# Patient Record
Sex: Female | Born: 1950 | Race: Black or African American | Hispanic: No | Marital: Single | Smoking: Never smoker
Health system: Southern US, Community
[De-identification: ages and names within clinical notes are randomized; demographics above are authoritative.]

## PROBLEM LIST (undated history)

## (undated) DIAGNOSIS — F17200 Nicotine dependence, unspecified, uncomplicated: Secondary | ICD-10-CM

## (undated) DIAGNOSIS — R5383 Other fatigue: Secondary | ICD-10-CM

## (undated) HISTORY — DX: Other fatigue: R53.83

## (undated) HISTORY — PX: TOOTH EXTRACTION: SUR596

## (undated) HISTORY — DX: Nicotine dependence, unspecified, uncomplicated: F17.200

---

## 2016-03-25 ENCOUNTER — Encounter (HOSPITAL_COMMUNITY): Payer: Self-pay | Admitting: Emergency Medicine

## 2016-03-25 ENCOUNTER — Emergency Department (HOSPITAL_COMMUNITY)
Admission: EM | Admit: 2016-03-25 | Discharge: 2016-03-25 | Disposition: A | Discharge status: Home / Self Care | Payer: Medicare Other | Attending: Emergency Medicine | Admitting: Emergency Medicine

## 2016-03-25 DIAGNOSIS — K0889 Other specified disorders of teeth and supporting structures: Secondary | ICD-10-CM | POA: Insufficient documentation

## 2016-03-25 DIAGNOSIS — K1379 Other lesions of oral mucosa: Secondary | ICD-10-CM

## 2016-03-25 DIAGNOSIS — N939 Abnormal uterine and vaginal bleeding, unspecified: Secondary | ICD-10-CM

## 2016-03-25 DIAGNOSIS — N938 Other specified abnormal uterine and vaginal bleeding: Secondary | ICD-10-CM | POA: Insufficient documentation

## 2016-03-25 LAB — CBC WITH DIFFERENTIAL/PLATELET
Basophils Absolute: 0 10*3/uL (ref 0.0–0.1)
Basophils Relative: 0 %
EOS ABS: 0.1 10*3/uL (ref 0.0–0.7)
EOS PCT: 1 %
HCT: 41.8 % (ref 36.0–46.0)
Hemoglobin: 13.6 g/dL (ref 12.0–15.0)
Lymphocytes Relative: 38 %
Lymphs Abs: 3 10*3/uL (ref 0.7–4.0)
MCH: 26.2 pg (ref 26.0–34.0)
MCHC: 32.5 g/dL (ref 30.0–36.0)
MCV: 80.4 fL (ref 78.0–100.0)
MONO ABS: 0.6 10*3/uL (ref 0.1–1.0)
Monocytes Relative: 7 %
Neutro Abs: 4.2 10*3/uL (ref 1.7–7.7)
Neutrophils Relative %: 54 %
PLATELETS: 195 10*3/uL (ref 150–400)
RBC: 5.2 MIL/uL — AB (ref 3.87–5.11)
RDW: 13.3 % (ref 11.5–15.5)
WBC: 7.9 10*3/uL (ref 4.0–10.5)

## 2016-03-25 LAB — URINE MICROSCOPIC-ADD ON: RBC / HPF: NONE SEEN RBC/hpf (ref 0–5)

## 2016-03-25 LAB — COMPREHENSIVE METABOLIC PANEL
ALK PHOS: 91 U/L (ref 38–126)
ALT: 52 U/L (ref 14–54)
AST: 37 U/L (ref 15–41)
Albumin: 4 g/dL (ref 3.5–5.0)
Anion gap: 11 (ref 5–15)
BILIRUBIN TOTAL: 0.5 mg/dL (ref 0.3–1.2)
BUN: 7 mg/dL (ref 6–20)
CO2: 24 mmol/L (ref 22–32)
CREATININE: 0.71 mg/dL (ref 0.44–1.00)
Calcium: 9.2 mg/dL (ref 8.9–10.3)
Chloride: 103 mmol/L (ref 101–111)
GFR calc Af Amer: >60 mL/min (ref >60)
Glucose, Bld: 196 mg/dL — ABNORMAL HIGH (ref 65–99)
Potassium: 4.3 mmol/L (ref 3.5–5.1)
Sodium: 138 mmol/L (ref 135–145)
TOTAL PROTEIN: 7.5 g/dL (ref 6.5–8.1)

## 2016-03-25 LAB — URINALYSIS, ROUTINE W REFLEX MICROSCOPIC
Bilirubin Urine: NEGATIVE
Glucose, UA: NEGATIVE mg/dL
HGB URINE DIPSTICK: NEGATIVE
KETONES UR: NEGATIVE mg/dL
Nitrite: NEGATIVE
PROTEIN: NEGATIVE mg/dL
Specific Gravity, Urine: 1.014 (ref 1.005–1.030)
pH: 5.5 (ref 5.0–8.0)

## 2016-03-25 MED ORDER — SODIUM CHLORIDE 0.9 % IV BOLUS (SEPSIS)
1000.0000 mL | Freq: Once | INTRAVENOUS | Status: AC
  Administered 2016-03-25: 1000 mL via INTRAVENOUS

## 2016-03-25 MED ORDER — PENICILLIN V POTASSIUM 500 MG PO TABS: 500.0000 mg | ORAL_TABLET | Freq: Four times a day (QID) | ORAL | Status: AC

## 2016-03-25 MED ORDER — MORPHINE SULFATE (PF) 4 MG/ML IV SOLN
4.0000 mg | Freq: Once | INTRAVENOUS | Status: AC
  Administered 2016-03-25: 4 mg via INTRAVENOUS
  Filled 2016-03-25: qty 1

## 2016-03-25 MED ORDER — NAPROXEN 500 MG PO TABS: 500.0000 mg | ORAL_TABLET | Freq: Two times a day (BID) | ORAL | Status: DC

## 2016-03-25 MED ORDER — LIDOCAINE VISCOUS 2 % MT SOLN
15.0000 mL | Freq: Once | OROMUCOSAL | Status: AC
  Administered 2016-03-25: 15 mL via OROMUCOSAL
  Filled 2016-03-25: qty 15

## 2016-03-25 MED ORDER — LIDOCAINE VISCOUS 2 % MT SOLN: 15.0000 mL | OROMUCOSAL | Status: DC | PRN

## 2016-03-25 MED ORDER — ONDANSETRON HCL 4 MG/2ML IJ SOLN
4.0000 mg | Freq: Once | INTRAMUSCULAR | Status: AC
  Administered 2016-03-25: 4 mg via INTRAVENOUS
  Filled 2016-03-25: qty 2

## 2016-03-25 NOTE — ED Provider Notes (Signed)
CSN: [id number]     Arrival date & time 03/25/16  0846 History   First MD Initiated Contact with Patient 03/25/16 [phone]     Chief Complaint  Patient presents with  . Vaginal Bleeding     (Consider location/radiation/quality/duration/timing/severity/associated sxs/prior Treatment) HPI   Annette Macdonald is a 65 y.o. female, Poor dentition and tooth extraction, presenting to the ED with vaginal bleeding and mouth pain since 2009. Pt states she was given a dental device in late 2008 in [place] that she states was experimental. It was an insert that was supposed to mimic a prosthesis. Pt states, "I used this device for a few months and then it began to rot my teeth. Then I began to have occasional vaginal bleeding." Pt has been using tylenol to control the pain. Pt rates her pain at 10/10, aching/soreness, nonradiating. Pt states she needs a referral to an oral surgeon for this issue, in addition to help with the pain. Patient denies fever/chills, nausea/vomiting, difficulty breathing or swallowing, or any other complaints.    History reviewed. No pertinent past medical history. Past Surgical History  Procedure Laterality Date  . Tooth extraction     No family history on file. Social History  Substance Use Topics  . Smoking status: Never Smoker   . Smokeless tobacco: None  . Alcohol Use: No   OB History    No data available     Review of Systems  Constitutional: Negative for fever and chills.  HENT: Positive for dental problem. Negative for trouble swallowing and voice change.   Respiratory: Negative for shortness of breath.   Cardiovascular: Negative for chest pain.  Gastrointestinal: Negative for nausea, vomiting and abdominal pain.  Genitourinary: Positive for vaginal bleeding.  Neurological: Negative for dizziness, light-headedness and headaches.  All other systems reviewed and are negative.     Allergies  Review of patient's allergies indicates no known  allergies.  Home Medications   Prior to Admission medications   Medication Sig Start Date End Date Taking? Authorizing Provider  acetaminophen (TYLENOL) 500 MG tablet Take 500 mg by mouth every 6 (six) hours as needed for mild pain.   Yes Historical Provider, MD  lidocaine (XYLOCAINE) 2 % solution Use as directed 15 mLs in the mouth or throat as needed for mouth pain. 03/25/16   Theda Payer C Khyla Mccumbers, PA-C  naproxen (NAPROSYN) 500 MG tablet Take 1 tablet (500 mg total) by mouth 2 (two) times daily. 03/25/16   Rea Reser C Elayjah Chaney, PA-C  penicillin v potassium (VEETID) 500 MG tablet Take 1 tablet (500 mg total) by mouth 4 (four) times daily. 03/25/16 04/01/16  Alithia Zavaleta C Stevi Hollinshead, PA-C   BP 146/68 mmHg  Pulse 66  Temp(Src) 98.7 F (37.1 C) (Oral)  Resp 18  Ht 5\' 8"  (1.727 m)  Wt 73.965 kg  BMI 24.80 kg/m2  SpO2 97% Physical Exam  Constitutional: She appears well-developed and well-nourished. No distress.  HENT:  Head: Normocephalic and atraumatic.  Entire set of maxillary teeth is gone with some stubs remaining. Those remaining stubs are blackened. Significant dental caries with decay noted on a few mandibular teeth. Tenderness throughout every gingival surface. No swelling or area of fluctuance to suggest an abscess.  Eyes: Conjunctivae are normal. Pupils are equal, round, and reactive to light.  Neck: Neck supple.  Cardiovascular: Normal rate, regular rhythm, normal heart sounds and intact distal pulses.   Pulmonary/Chest: Effort normal and breath sounds normal. No respiratory distress.  Abdominal: Soft. Bowel sounds are normal. There  is no tenderness. There is no guarding.  Musculoskeletal: She exhibits no edema or tenderness.  Lymphadenopathy:    She has no cervical adenopathy.  Neurological: She is alert.  Skin: Skin is warm and dry. She is not diaphoretic.  Psychiatric: She has a normal mood and affect. Her behavior is normal.  Nursing note and vitals reviewed.   ED Course  Procedures (including critical  care time) Labs Review Labs Reviewed  COMPREHENSIVE METABOLIC PANEL - Abnormal; Notable for the following:    Glucose, Bld 196 (*)    All other components within normal limits  CBC WITH DIFFERENTIAL/PLATELET - Abnormal; Notable for the following:    RBC 5.20 (*)    All other components within normal limits  URINALYSIS, ROUTINE W REFLEX MICROSCOPIC (NOT AT [facility]) - Abnormal; Notable for the following:    Leukocytes, UA SMALL (*)    All other components within normal limits  URINE MICROSCOPIC-ADD ON - Abnormal; Notable for the following:    Squamous Epithelial / LPF 6-30 (*)    Bacteria, UA RARE (*)    All other components within normal limits    Imaging Review No results found. I have personally reviewed and evaluated these lab results as part of my medical decision-making.   EKG Interpretation None      MDM   Final diagnoses:  Mouth pain  Vaginal bleeding    Annette Macdonald presents with mouth pain and vaginal bleeding.  Findings and plan of care discussed with Gareth Morgan, MD.   Patient is nontoxic appearing, afebrile, not tachycardic, not tachypneic, maintains SPO2 of 97-98% on room air, and is in no apparent distress. Patient has no signs of sepsis or other serious or life-threatening condition. There are no signs or symptoms of acute or emergent conditions. No indication for imaging here in the ED. Referral to dentistry, PCP, and OBGYN given. Case management consult placed to give the patient assistance resources in the area. Patient advised to follow-up with a dentist, establish herself with a PCP, and address her vaginal bleeding with OB/GYN. Return precautions discussed. Patient voiced understanding of these instructions, agrees to the plan, and is comfortable with discharge.      Filed Vitals:   03/25/16 0905 03/25/16 0908 03/25/16 1132 03/25/16 1133  BP: 143/77  149/91 149/91  Pulse: 108   81  Temp: 98.7 F (37.1 C)     TempSrc: Oral     Resp: 18   17   Height: 5\' 8"  (1.727 m)     Weight:  73.965 kg    SpO2: 98%   98%   Filed Vitals:   03/25/16 0908 03/25/16 1132 03/25/16 1133 03/25/16 1230  BP:  149/91 149/91 146/68  Pulse:   81 66  Temp:      TempSrc:      Resp:   17 18  Height:      Weight: 73.965 kg     SpO2:   98% 97%     Lorayne Bender, PA-C 03/26/16 [place], MD 03/29/16 1104

## 2016-03-25 NOTE — ED Notes (Signed)
Pt c/o vaginal bleed that began back in 2009. Pt reports that a dentist did an experimental procedure that caused her teeth to rot out.

## 2016-03-25 NOTE — Care Management Note (Signed)
Case Management Note  Patient Details  Name: Jahyra Marbach MRN: [id number] Date of Birth: 1951-10-20  Subjective/Objective:                  65 y.o. female, presenting to the ED with vaginal bleeding and mouth pain since 2009./  Home alone.   Action/Plan: Follow for disposition needs. /Provide resources for follow-up   Expected Discharge Date:       03/25/16           Expected Discharge Plan:  Home/Self Care  In-House Referral:  NA  Discharge planning Services  CM Consult  Post Acute Care Choice:    Choice offered to:     DME Arranged:    DME Agency:     HH Arranged:    HH Agency:     Status of Service:     Medicare Important Message Given:    Date Medicare IM Given:    Medicare IM give by:    Date Additional Medicare IM Given:    Additional Medicare Important Message give by:     If discussed at [place] of Stay Meetings, dates discussed:    Additional Comments: EDCM consulted to assist with community resources for PCP and GYN. EDCM spoke with pt at beside who states she has PCP but needs oral surgeon and GYN.  EDCM provided list of GYN in area.  Pt states she will call number on insurance card for dental resource to insure services will be paid for.  No further CM needs communicated at this time. Fuller Mandril, RN 03/25/2016, 11:24 AM

## 2016-03-25 NOTE — Discharge Planning (Signed)
EDCM consulted to assist with community resources for PCP and GYN. EDCM spoke with pt at beside who states she has PCP but needs oral surgeon and GYN.  EDCM provided list of GYN in area.  Pt states she will call number on insurance card for dental resource to insure services will be paid for.  No further CM needs communicated at this time.

## 2016-03-25 NOTE — Discharge Instructions (Signed)
You have been seen today for mouth pain and vaginal bleeding. Your lab tests showed no abnormalities.  1. Follow up with a dentist as soon as possible, but certainly within the timeframe you are taking the antibiotic. See the attached resource guide to help you select a dentist. The dentist will have to refer you to an oral surgeon, as we are unable make that referral. Please take all of your antibiotics until finished!   You may develop abdominal discomfort or diarrhea from the antibiotic.  You may help offset this with probiotics which you can buy or get in yogurt. Do not eat or take the probiotics until 2 hours after your antibiotic.  2. Follow-up with OB/GYN as soon as possible for the vaginal bleeding. Call the women's outpatient clinic to set up an appointment. 3. Follow up with PCP as needed. Return to ED should symptoms worsen.  Dental Assistance If the dentist on-call cannot see you, please use the resources below:   Patients with Medicaid: [place], [place] [address], [phone]  If unable to pay, or uninsured, contact [facility] [phone]) or [place] [phone] in [place], [place] in [facility]) to become qualified for the adult dental clinic  Other [place]- [place], [place], [place], [address]    [phone], Ext. [phone]    2nd and 4th Thursday of the month at 6:30am    10 clients each day by appointment, can sometimes see walk-in     patients if someone does not show for an appointment [facility]- [address] [place], [place], [address]    [phone] [facility]- [address], [place], [place], [address]    [phone]  [place] [phone]  RESOURCE GUIDE  Chronic Pain Problems: [facility]  [phone] Patients need to be referred by their primary care doctor.  Insufficient Money for Medicine: Contact [employer]:  call "211" or [place] [phone].  No Primary Care Doctor: - Call [facility] Connect  [phone] - can help you locate a primary care doctor that  accepts your insurance, provides certain services, etc. - Physician Referral [phone]  Agencies that provide inexpensive medical care: - [facility]  [facility]  [phone] - [facility]  [phone] - [facility]  [phone] - [facility]  [facility]  [phone]  [place] Providers: - [facility]- [address]  [phone], Mon-Fri 9am-7pm, Sat 9am-1pm - [facility]- [place], [address] [place]  [place], [address]  [place]- [address]  [place], [address], [phone]  Only accepts [place] patients after they have their name  applied to their card  Self Pay (no insurance) in [place]: - Sickle Cell Patients: Dr Kevan Ny, [facility]  [place], [facility]- [place]  [facility] [place]- [place], [place]  [facility]- see information above (Speak to [place] if you do not have insurance)       -  [facility]- [place], [place],  [place], [place]  Dr Vista Lawman-  [address], [place], [facility]- [address], [phone]       -  [facility]- [address]  [place], [place], also [address], [phone]       -    [facility]- [address], [place], 1st & 3rd Saturday   every month, 10am-1pm  1) Find a Doctor and Pay Out of Pocket Although you won't have to find out who is covered by your insurance plan, it is a good idea to ask around and get recommendations. You will then need to call the office and see if the doctor you have chosen will accept you as a new patient and what types of options they offer for patients who are self-pay. Some doctors offer discounts or will set up payment plans for their patients who do not have insurance, but you will need to ask so you aren't surprised when you get to your appointment.  2) Contact Your Local Health Department Not all health departments have doctors that can see patients for sick visits, but many do, so it is worth a call to see if yours does. If you don't know where your local health department is, you can check in your phone book. The CDC also has a tool to help you locate your state's health department, and many state websites also have listings of all of their local health departments.  3) Find a [facility] If your illness is not likely to be very severe or complicated, you may want to try a walk in clinic. These are popping up all over the country in pharmacies, drugstores, and shopping centers. They're usually staffed by nurse practitioners or physician assistants that have been trained to treat common illnesses and complaints. They're usually fairly quick and inexpensive. However, if you have serious medical issues or chronic medical problems, these are probably not your best option  STD Testing - [place], [facility], [address] [place], phone [phone] or [phone].  Monday - Friday, call for an appointment. - [place], [facility], [address],  [place], phone [phone] or [phone].  Monday - Friday, call for an appointment.  Abuse/Neglect: - [place] [phone] - [place] [phone] (After Hours)  Emergency Shelter:  [facility] [phone]  Maternity Homes: - Room at the [place] [phone] - [place] [phone]  MRSA Hotline #:   430-886-3215  [facility]. [address]         [place]  Sela Hua Phone:  [phone]                                  Phone:  [phone]                   Phone:  [place], [place] in [place], [address],                                  [place] [phone] or [phone] (After Hours)   [place]  Substance Abuse Resources: - Alcohol and Drug Services  [phone] - [place] [phone] - [facility] [place] [phone] Chinita Pester [phone] - Residential & Outpatient Substance Abuse Program  [phone]  Psychological Services: - [place]  [place]  [place], [phone] [place]. [address] [place], [place]: [phone] or [phone], PicCapture.uy  Dental Assistance  If unable to pay or uninsured, contact:  [facility] or [facility]. to become qualified for the adult dental clinic.  Patients with Medicaid: [facility] [phone] W[address] [place], [place] [address], [phone]  If unable to pay, or uninsured, contact [facility] [phone]) or [place] [phone] in [place], [place] in [facility]) to become qualified for the adult dental clinic   Other [place]- [place], [place], [place], [address], [place], [place], 2nd and 4th Thursday of the month at 6:30am.  10 clients each day by appointment, can sometimes see walk-in patients if someone does not show for an appointment. [facility]- [address] [place], [place], [address], [place], [place], [place], [address], [place] Department- [place] Department- [place] Department- [phone]

## 2017-07-02 ENCOUNTER — Ambulatory Visit (HOSPITAL_COMMUNITY)
Admission: RE | Admit: 2017-07-02 | Discharge: 2017-07-02 | Disposition: A | Discharge status: Home / Self Care | Payer: Medicare Other | Source: Ambulatory Visit | Attending: Family Medicine | Admitting: Family Medicine

## 2017-07-02 ENCOUNTER — Other Ambulatory Visit (HOSPITAL_COMMUNITY)
Admission: RE | Admit: 2017-07-02 | Discharge: 2017-07-02 | Disposition: A | Discharge status: Home / Self Care | Payer: Medicare Other | Source: Ambulatory Visit | Attending: Family Medicine | Admitting: Family Medicine

## 2017-07-02 ENCOUNTER — Ambulatory Visit (INDEPENDENT_AMBULATORY_CARE_PROVIDER_SITE_OTHER): Payer: Medicare Other | Admitting: Family Medicine

## 2017-07-02 ENCOUNTER — Encounter: Payer: Self-pay | Admitting: Family Medicine

## 2017-07-02 VITALS — BP 129/64 | HR 85 | Temp 98.5°F | Resp 16 | Ht 68.0 in | Wt 152.0 lb

## 2017-07-02 DIAGNOSIS — B373 Candidiasis of vulva and vagina: Secondary | ICD-10-CM | POA: Diagnosis not present

## 2017-07-02 DIAGNOSIS — N938 Other specified abnormal uterine and vaginal bleeding: Secondary | ICD-10-CM | POA: Diagnosis not present

## 2017-07-02 DIAGNOSIS — Z1159 Encounter for screening for other viral diseases: Secondary | ICD-10-CM

## 2017-07-02 DIAGNOSIS — M79671 Pain in right foot: Secondary | ICD-10-CM

## 2017-07-02 DIAGNOSIS — F40298 Other specified phobia: Secondary | ICD-10-CM | POA: Diagnosis not present

## 2017-07-02 DIAGNOSIS — Z01419 Encounter for gynecological examination (general) (routine) without abnormal findings: Secondary | ICD-10-CM | POA: Insufficient documentation

## 2017-07-02 DIAGNOSIS — R5383 Other fatigue: Secondary | ICD-10-CM | POA: Diagnosis not present

## 2017-07-02 DIAGNOSIS — M65871 Other synovitis and tenosynovitis, right ankle and foot: Secondary | ICD-10-CM | POA: Diagnosis not present

## 2017-07-02 DIAGNOSIS — K625 Hemorrhage of anus and rectum: Secondary | ICD-10-CM

## 2017-07-02 DIAGNOSIS — R3589 Other polyuria: Secondary | ICD-10-CM

## 2017-07-02 DIAGNOSIS — Z5320 Procedure and treatment not carried out because of patient's decision for unspecified reasons: Secondary | ICD-10-CM | POA: Diagnosis not present

## 2017-07-02 DIAGNOSIS — Z114 Encounter for screening for human immunodeficiency virus [HIV]: Secondary | ICD-10-CM

## 2017-07-02 DIAGNOSIS — R358 Other polyuria: Secondary | ICD-10-CM | POA: Diagnosis not present

## 2017-07-02 LAB — CBC WITH DIFFERENTIAL/PLATELET
Basophils Absolute: 0 cells/uL (ref 0–200)
Basophils Relative: 0 %
EOS PCT: 1 %
Eosinophils Absolute: 68 cells/uL (ref 15–500)
HCT: 44.4 % (ref 35.0–45.0)
Hemoglobin: 14.7 g/dL (ref 11.7–15.5)
LYMPHS PCT: 40 %
Lymphs Abs: 2720 cells/uL (ref 850–3900)
MCH: 26.6 pg — ABNORMAL LOW (ref 27.0–33.0)
MCHC: 33.1 g/dL (ref 32.0–36.0)
MCV: 80.3 fL (ref 80.0–100.0)
MPV: 10.1 fL (ref 7.5–12.5)
Monocytes Absolute: 544 cells/uL (ref 200–950)
Monocytes Relative: 8 %
NEUTROS PCT: 51 %
Neutro Abs: 3468 cells/uL (ref 1500–7800)
Platelets: 212 10*3/uL (ref 140–400)
RBC: 5.53 MIL/uL — AB (ref 3.80–5.10)
RDW: 13.1 % (ref 11.0–15.0)
WBC: 6.8 10*3/uL (ref 3.8–10.8)

## 2017-07-02 LAB — POCT URINALYSIS DIP (DEVICE)
Bilirubin Urine: NEGATIVE
GLUCOSE, UA: 500 mg/dL — AB
Hgb urine dipstick: NEGATIVE
KETONES UR: NEGATIVE mg/dL
LEUKOCYTES UA: NEGATIVE
Nitrite: NEGATIVE
Protein, ur: NEGATIVE mg/dL
SPECIFIC GRAVITY, URINE: 1.02 (ref 1.005–1.030)
UROBILINOGEN UA: 0.2 mg/dL (ref 0.0–1.0)
pH: 5.5 (ref 5.0–8.0)

## 2017-07-02 LAB — TSH: TSH: 1.15 m[IU]/L

## 2017-07-02 LAB — POCT GLYCOSYLATED HEMOGLOBIN (HGB A1C)

## 2017-07-02 NOTE — Patient Instructions (Addendum)
Will follow up by phone with any abnormal laboratory results.    DTaP Vaccine (Diphtheria, Tetanus, and Pertussis): What You Need to Know 1. Why get vaccinated? Diphtheria, tetanus, and pertussis are serious diseases caused by bacteria. Diphtheria and pertussis are spread from person to person. Tetanus enters the body through cuts or wounds. DIPHTHERIA causes a thick covering in the back of the throat.  It can lead to breathing problems, paralysis, heart failure, and even death.  TETANUS (Lockjaw) causes painful tightening of the muscles, usually all over the body.  It can lead to "locking" of the jaw so the victim cannot open his mouth or swallow. Tetanus leads to death in up to 2 out of 10 cases.  PERTUSSIS (Whooping Cough) causes coughing spells so bad that it is hard for infants to eat, drink, or breathe. These spells can last for weeks.  It can lead to pneumonia, seizures (jerking and staring spells), brain damage, and death.  Diphtheria, tetanus, and pertussis vaccine (DTaP) can help prevent these diseases. Most children who are vaccinated with DTaP will be protected throughout childhood. Many more children would get these diseases if we stopped vaccinating. DTaP is a safer version of an older vaccine called DTP. DTP is no longer used in the [place]. 2. Who should get DTaP vaccine and when? Children should get 5 doses of DTaP vaccine, one dose at each of the following ages:  2 months  4 months  6 months  15-18 months  4-6 years  DTaP may be given at the same time as other vaccines. 3. Some children should not get DTaP vaccine or should wait  Children with minor illnesses, such as a cold, may be vaccinated. But children who are moderately or severely ill should usually wait until they recover before getting DTaP vaccine.  Any child who had a life-threatening allergic reaction after a dose of DTaP should not get another dose.  Any child who suffered a brain or  nervous system disease within 7 days after a dose of DTaP should not get another dose.  Talk with your doctor if your child: ? had a seizure or collapsed after a dose of DTaP, ? cried non-stop for 3 hours or more after a dose of DTaP, ? had a fever over 105F after a dose of DTaP. Ask your doctor for more information. Some of these children should not get another dose of pertussis vaccine, but may get a vaccine without pertussis, called DT. 4. Older children and adults DTaP is not licensed for adolescents, adults, or children 51 years of age and older. But older people still need protection. A vaccine called Tdap is similar to DTaP. A single dose of Tdap is recommended for people 11 through 64 years of age. Another vaccine, called Td, protects against tetanus and diphtheria, but not pertussis. It is recommended every 10 years. There are separate Vaccine Information Statements for these vaccines. 5. What are the risks from DTaP vaccine? Getting diphtheria, tetanus, or pertussis disease is much riskier than getting DTaP vaccine. However, a vaccine, like any medicine, is capable of causing serious problems, such as severe allergic reactions. The risk of DTaP vaccine causing serious harm, or death, is extremely small. Mild problems (common)  Fever (up to about 1 child in 4)  Redness or swelling where the shot was given (up to about 1 child in 4)  Soreness or tenderness where the shot was given (up to about 1 child in 4) These problems occur more  often after the 4th and 5th doses of the DTaP series than after earlier doses. Sometimes the 4th or 5th dose of DTaP vaccine is followed by swelling of the entire arm or leg in which the shot was given, lasting 1-7 days (up to about 1 child in 64). Other mild problems include:  Fussiness (up to about 1 child in 3)  Tiredness or poor appetite (up to about 1 child in 10)  Vomiting (up to about 1 child in 45) These problems generally occur 1-3 days after  the shot. Moderate problems (uncommon)  Seizure (jerking or staring) (about 1 child out of 14,000)  Non-stop crying, for 3 hours or more (up to about 1 child out of 1,000)  High fever, over 105F (about 1 child out of 16,000) Severe problems (very rare)  Serious allergic reaction (less than 1 out of a million doses)  Several other severe problems have been reported after DTaP vaccine. These include: ? Long-term seizures, coma, or lowered consciousness ? Permanent brain damage. These are so rare it is hard to tell if they are caused by the vaccine. Controlling fever is especially important for children who have had seizures, for any reason. It is also important if another family member has had seizures. You can reduce fever and pain by giving your child an aspirin-free pain reliever when the shot is given, and for the next 24 hours, following the package instructions. 6. What if there is a serious reaction? What should I look for? Look for anything that concerns you, such as signs of a severe allergic reaction, very high fever, or behavior changes. Signs of a severe allergic reaction can include hives, swelling of the face and throat, difficulty breathing, a fast heartbeat, dizziness, and weakness. These would start a few minutes to a few hours after the vaccination. What should I do?  If you think it is a severe allergic reaction or other emergency that can't wait, call 9-1-1 or get the person to the nearest hospital. Otherwise, call your doctor.  Afterward, the reaction should be reported to the Vaccine Adverse Event Reporting System (VAERS). Your doctor might file this report, or you can do it yourself through the VAERS web site at www.vaers.SamedayNews.es, or by calling (402)724-4006. ? VAERS is only for reporting reactions. They do not give medical advice. 7. The National Vaccine Injury Compensation Program The [employer] Vaccine Injury Compensation Program (VICP) is a federal program that  was created to compensate people who may have been injured by certain vaccines. Persons who believe they may have been injured by a vaccine can learn about the program and about filing a claim by calling (754)085-4508 or visiting the [place] website at GoldCloset.com.ee. 8. How can I learn more?  Ask your doctor.  Call your local or state health department.  Contact the Centers for Disease Control and Prevention (CDC): ? Call 249-835-8157 (1-800-CDC-INFO) or ? Visit CDC's website at http://hunter.com/ CDC DTaP Vaccine (Diphtheria, Tetanus, and Pertussis) VIS (04/03/06) This information is not intended to replace advice given to you by your health care provider. Make sure you discuss any questions you have with your health care provider. Document Released: 09/01/2006 Document Revised: 07/25/2016 Document Reviewed: 07/25/2016 Elsevier Interactive Patient Education  2017 [place] A mammogram is an X-ray of the breasts that is done to check for changes that are not normal. This test can screen for and find any changes that may suggest breast cancer. This test can also help to find other changes  and variations in the breast. What happens before the procedure?  Have this test done about 1-2 weeks after your period. This is usually when your breasts are the least tender.  If you are visiting a new doctor or clinic, send any past mammogram images to your new doctor's office.  Wash your breasts and under your arms the day of the test.  Do not use deodorants, perfumes, lotions, or powders on the day of the test.  Take off any jewelry from your neck.  Wear clothes that you can change into and out of easily. What happens during the procedure?  You will undress from the waist up. You will put on a gown.  You will stand in front of the X-ray machine.  Each breast will be placed between two plastic or glass plates. The plates will press down on your breast for a  few seconds. Try to stay as relaxed as possible. This does not cause any harm to your breasts. Any discomfort you feel will be very brief.  X-rays will be taken from different angles of each breast. The procedure may vary among doctors and hospitals. What happens after the procedure?  The mammogram will be looked at by a specialist (radiologist).  You may need to do certain parts of the test again. This depends on the quality of the images.  Ask when your test results will be ready. Make sure you get your test results.  You may go back to your normal activities. This information is not intended to replace advice given to you by your health care provider. Make sure you discuss any questions you have with your health care provider. Document Released: 01/31/2009 Document Revised: 04/11/2016 Document Reviewed: 01/13/2015 Elsevier Interactive Patient Education  [employer].

## 2017-07-02 NOTE — Progress Notes (Signed)
Subjective:    Patient ID: Annette Macdonald, female    DOB: [birth year], 65 y.o.   MRN: [id number]  HPI  Ms. Annette Macdonald, a very pleasant 66 year old female presents to establish care. Annette Macdonald has been lost to follow up over the past several years. She says that she does not go to the doctors because she has always been healthy.   She is complaining of right foot pain. She drooped a box on right foot several months ago. She says that she typically has foot pain with walking and prolonged standing. She has taken Ibuprofen and using foot soaks intermittently without sustained relief. Current pain intensity is 2-3/10 and is described as sore and aching.   Ms. Annette Macdonald is also complaining of periodic fatigue. Symptoms began several months ago.  Symptoms of her fatigue have been diffuse soft tissue aches and pains, general malaise and lack of interest in usual activities.  Patient denies unusual rashes, cold intolerance, constipation and change in hair texture., excessive menstrual bleeding and witnessed or suspected sleep apnea. Symptoms have been intermittent. Severity has been moderate.   She is also complaining of vaginal itching and discharge. She says that symptoms have been occurring intermittently over the past several months. She says that she has not attempted any OTC interventions to alleviate symptoms.    Past Medical History:  Diagnosis Date  . Fatigue   . Tobacco dependence    Social History   Social History  . Marital status: Single    Spouse name: N/A  . Number of children: N/A  . Years of education: N/A   Occupational History  . Not on file.   Social History Main Topics  . Smoking status: Never Smoker  . Smokeless tobacco: Never Used  . Alcohol use No  . Drug use: No  . Sexual activity: Not on file   Other Topics Concern  . Not on file   Social History Narrative  . No narrative on file    There is no immunization history on file for this patient. Allergies   Allergen Reactions  . Penicillins Rash    Review of Systems  Constitutional: Positive for fatigue.  HENT: Negative.   Eyes: Negative for photophobia, redness and visual disturbance.  Respiratory: Negative.   Cardiovascular: Negative.  Negative for chest pain and palpitations.  Gastrointestinal: Negative.   Endocrine: Positive for polyuria.  Genitourinary: Positive for vaginal discharge. Negative for dysuria, flank pain, frequency and genital sores.  Musculoskeletal: Negative.   Allergic/Immunologic: Negative for immunocompromised state.  Neurological: Negative.   Hematological: Negative.   Psychiatric/Behavioral: Negative.         Objective:   Physical Exam  Constitutional: She is oriented to person, place, and time. She appears well-developed and well-nourished.  HENT:  Head: Normocephalic and atraumatic.  Right Ear: External ear normal.  Left Ear: External ear normal.  Nose: Nose normal.  Mouth/Throat: Oropharynx is clear and moist.  Eyes: Pupils are equal, round, and reactive to light. Conjunctivae and EOM are normal.  Neck: Normal range of motion. Neck supple.  Pulmonary/Chest: Effort normal and breath sounds normal.  Abdominal: Soft. Bowel sounds are normal.  Genitourinary: There is tenderness on the right labia. There is tenderness on the left labia. Uterus is tender. Uterus is not deviated. Cervix exhibits discharge and friability. There is tenderness in the vagina. Vaginal discharge found.  Musculoskeletal:       Right ankle: She exhibits decreased range of motion. She exhibits no swelling, no deformity  and normal pulse.  Neurological: She is alert and oriented to person, place, and time. She has normal reflexes.  Skin: Skin is warm and dry.  Psychiatric: She has a normal mood and affect. Her behavior is normal. Judgment and thought content normal.     BP 129/64 (BP Location: Left Arm, Patient Position: Sitting, Cuff Size: Normal)   Pulse 85   Temp 98.5 F (36.9  C) (Oral)   Resp 16   Ht 5\' 8"  (1.727 m)   Wt 152 lb (68.9 kg)   SpO2 98%   BMI 23.11 kg/m  Assessment & Plan:  1. Pap smear, as part of routine gynecological examination Will follow up by phone with any abnormal results Discussed the purpose of pap smear at length.  - Cytology - PAP [place]  2. Polyuria  - Hemoglobin A1c  3. Rectal bleeding POCT occult blood positive. Will send a referral to GI for further work up and evaluaion Patient has never hand a colonoscopy - Ambulatory referral to Gastroenterology  4. Need for hepatitis C screening test - Hepatitis C Antibody  5. Screening for HIV (human immunodeficiency virus) - HIV antibody (with reflex)  6. Right foot pain Recommend warm, moist compresses.  Elevate to heart level while at rest - DG Foot Complete Right; Future  7. Fatigue, unspecified type - CBC with Differential - COMPLETE METABOLIC PANEL WITH GFR - TSH - Hemoglobin A1c  8. Uterine bleeding, dysfunctional - US Pelvis Complete; Future - US Transvaginal Non-OB; Future  9. Fear of vaccinations Patient refused vaccinations on today. Given written information about vaccinations.   10. Mammogram declined Annette Macdonald refused mammogram. Given written information about mammogram   RTC: Will follow up by phone with abnormal laboratory results. Will follow up in office in 1 month.    Donia Pounds  MSN, FNP-C [facility] [address] [place], [place] [address] [phone]

## 2017-07-03 ENCOUNTER — Encounter: Payer: Self-pay | Admitting: Gastroenterology

## 2017-07-03 LAB — COMPLETE METABOLIC PANEL WITH GFR
ALT: 25 U/L (ref 6–29)
AST: 20 U/L (ref 10–35)
Albumin: 4.3 g/dL (ref 3.6–5.1)
Alkaline Phosphatase: 116 U/L (ref 33–130)
BUN: 8 mg/dL (ref 7–25)
CO2: 22 mmol/L (ref 20–32)
Calcium: 10 mg/dL (ref 8.6–10.4)
Chloride: 97 mmol/L — ABNORMAL LOW (ref 98–110)
Creat: 0.66 mg/dL (ref 0.50–0.99)
GFR, Est African American: >89 mL/min (ref >=60)
GFR, Est Non African American: >89 mL/min (ref >=60)
GLUCOSE: 284 mg/dL — AB (ref 65–99)
POTASSIUM: 4.6 mmol/L (ref 3.5–5.3)
SODIUM: 134 mmol/L — AB (ref 135–146)
Total Bilirubin: 0.6 mg/dL (ref 0.2–1.2)
Total Protein: 7.2 g/dL (ref 6.1–8.1)

## 2017-07-03 LAB — HEMOGLOBIN A1C: Hgb A1c MFr Bld: >14 % — ABNORMAL HIGH (ref <5.7)

## 2017-07-03 LAB — HIV ANTIBODY (ROUTINE TESTING W REFLEX): HIV 1&2 Ab, 4th Generation: NONREACTIVE

## 2017-07-03 LAB — HEPATITIS C ANTIBODY: HCV Ab: NONREACTIVE

## 2017-07-04 ENCOUNTER — Other Ambulatory Visit: Payer: Self-pay | Admitting: Family Medicine

## 2017-07-04 ENCOUNTER — Telehealth: Payer: Self-pay | Admitting: Family Medicine

## 2017-07-04 ENCOUNTER — Other Ambulatory Visit: Payer: Self-pay

## 2017-07-04 DIAGNOSIS — B373 Candidiasis of vulva and vagina: Secondary | ICD-10-CM

## 2017-07-04 DIAGNOSIS — E119 Type 2 diabetes mellitus without complications: Secondary | ICD-10-CM | POA: Insufficient documentation

## 2017-07-04 DIAGNOSIS — B3731 Acute candidiasis of vulva and vagina: Secondary | ICD-10-CM

## 2017-07-04 DIAGNOSIS — Z794 Long term (current) use of insulin: Secondary | ICD-10-CM

## 2017-07-04 LAB — CYTOLOGY - PAP
Bacterial vaginitis: NEGATIVE
Candida vaginitis: POSITIVE — AB
Chlamydia: NEGATIVE
Diagnosis: NEGATIVE
Neisseria Gonorrhea: NEGATIVE
Trichomonas: NEGATIVE

## 2017-07-04 MED ORDER — METFORMIN HCL 500 MG PO TABS: 500.0000 mg | ORAL_TABLET | Freq: Two times a day (BID) | ORAL | 3 refills | Status: DC

## 2017-07-04 MED ORDER — INSULIN GLARGINE 100 UNIT/ML SOLOSTAR PEN: 20.0000 [IU] | PEN_INJECTOR | Freq: Every day | SUBCUTANEOUS | 99 refills | Status: DC

## 2017-07-04 MED ORDER — GLUCOSE BLOOD VI STRP: ORAL_STRIP | 12 refills | Status: DC

## 2017-07-04 MED ORDER — ACCU-CHEK SOFT TOUCH LANCETS MISC: 12 refills | Status: DC

## 2017-07-04 MED ORDER — FLUCONAZOLE 150 MG PO TABS: 150.0000 mg | ORAL_TABLET | Freq: Once | ORAL | 0 refills | Status: AC

## 2017-07-04 MED ORDER — ACCU-CHEK AVIVA DEVI: 0 refills | Status: DC

## 2017-07-04 MED ORDER — "INSULIN SYRINGE-NEEDLE U-100 29G X 1/2"" 0.3 ML MISC": 1.0000 | Freq: Every day | 99 refills | Status: DC

## 2017-07-04 MED ORDER — PEN NEEDLES 31G X 6 MM MISC: 1.0000 | Freq: Every day | 0 refills | Status: DC

## 2017-07-04 NOTE — Telephone Encounter (Addendum)
Annette Macdonald, a 66 year old female that presented to establish care on 07/02/2017. She had a hemoglobin a1C that is greater than 14, which is consistent with type 2 diabetes mellitus.   Will start Lantus 20 units at bedtime.  Will check blood sugars 3 times per day before meals and at bedtime Will start Metformin 500 mg twice daily with meals.     Will follow up in 1 month for dmII.     Unable to reach patient, phone has been disconnected. No contacts listed. Will send a letter.     Donia Pounds  MSN, FNP-C [facility] [address] [place], [place] [address] [phone]

## 2017-07-04 NOTE — Progress Notes (Signed)
Meds ordered this encounter  Medications  . fluconazole (DIFLUCAN) 150 MG tablet    Sig: Take 1 tablet (150 mg total) by mouth once.    Dispense:  1 tablet    Refill:  0     Donia Pounds  MSN, FNP-C [facility] [address] [place], [place] [address] [phone]

## 2017-07-06 ENCOUNTER — Encounter: Payer: Self-pay | Admitting: Family Medicine

## 2017-07-07 ENCOUNTER — Other Ambulatory Visit: Payer: Self-pay | Admitting: Family Medicine

## 2017-07-07 MED ORDER — ACCU-CHEK SOFT TOUCH LANCETS MISC: 12 refills | Status: DC

## 2017-07-07 MED ORDER — ACCU-CHEK AVIVA DEVI: 0 refills | Status: AC

## 2017-07-07 MED ORDER — PEN NEEDLES 31G X 6 MM MISC: 1.0000 | Freq: Every day | 0 refills | Status: DC

## 2017-07-07 MED ORDER — GLUCOSE BLOOD VI STRP: ORAL_STRIP | 12 refills | Status: DC

## 2017-07-07 MED ORDER — INSULIN GLARGINE 100 UNIT/ML SOLOSTAR PEN: 20.0000 [IU] | PEN_INJECTOR | Freq: Every day | SUBCUTANEOUS | 99 refills | Status: DC

## 2017-07-07 MED ORDER — "INSULIN SYRINGE-NEEDLE U-100 29G X 1/2"" 0.3 ML MISC": 1.0000 | Freq: Every day | 99 refills | Status: DC

## 2017-07-07 MED ORDER — METFORMIN HCL 500 MG PO TABS: 500.0000 mg | ORAL_TABLET | Freq: Two times a day (BID) | ORAL | 3 refills | Status: DC

## 2017-07-07 NOTE — Progress Notes (Signed)
Meds ordered this encounter  Medications  . metFORMIN (GLUCOPHAGE) 500 MG tablet    Sig: Take 1 tablet (500 mg total) by mouth 2 (two) times daily with a meal.    Dispense:  180 tablet    Refill:  3  . Lancets (ACCU-CHEK SOFT TOUCH) lancets    Sig: Three times per day and at bedtime. Use as instructed    Dispense:  100 each    Refill:  12  . Insulin Syringe-Needle U-100 (B-D INS SYR ULTRAFINE .3CC/29G) 29G X 1/2" 0.3 ML MISC    Sig: 1 each by Does not apply route at bedtime.    Dispense:  100 each    Refill:  prn  . Insulin Pen Needle (PEN NEEDLES) 31G X 6 MM MISC    Sig: 1 each by Does not apply route at bedtime.    Dispense:  100 each    Refill:  0  . Insulin Glargine (LANTUS SOLOSTAR) 100 UNIT/ML Solostar Pen    Sig: Inject 20 Units into the skin daily at 10 pm.    Dispense:  5 pen    Refill:  PRN  . glucose blood (ACCU-CHEK AVIVA) test strip    Sig: Three times per day and at bedtime. Use as instructed    Dispense:  100 each    Refill:  12  . Blood Glucose Monitoring Suppl (ACCU-CHEK AVIVA) device    Sig: Use as instructed    Dispense:  1 each    Refill:  0

## 2017-07-11 ENCOUNTER — Ambulatory Visit (INDEPENDENT_AMBULATORY_CARE_PROVIDER_SITE_OTHER): Payer: Medicare Other | Admitting: Family Medicine

## 2017-07-11 ENCOUNTER — Ambulatory Visit (HOSPITAL_COMMUNITY)
Admission: RE | Admit: 2017-07-11 | Discharge: 2017-07-11 | Disposition: A | Discharge status: Home / Self Care | Payer: Medicare Other | Source: Ambulatory Visit | Attending: Family Medicine | Admitting: Family Medicine

## 2017-07-11 ENCOUNTER — Encounter: Payer: Self-pay | Admitting: Family Medicine

## 2017-07-11 VITALS — BP 156/84 | HR 84 | Temp 87.0°F | Resp 16 | Ht 68.0 in | Wt 153.0 lb

## 2017-07-11 DIAGNOSIS — E119 Type 2 diabetes mellitus without complications: Secondary | ICD-10-CM | POA: Diagnosis not present

## 2017-07-11 DIAGNOSIS — Z794 Long term (current) use of insulin: Secondary | ICD-10-CM

## 2017-07-11 DIAGNOSIS — D259 Leiomyoma of uterus, unspecified: Secondary | ICD-10-CM | POA: Insufficient documentation

## 2017-07-11 DIAGNOSIS — N938 Other specified abnormal uterine and vaginal bleeding: Secondary | ICD-10-CM

## 2017-07-11 DIAGNOSIS — Z7189 Other specified counseling: Secondary | ICD-10-CM

## 2017-07-11 LAB — POCT URINALYSIS DIP (DEVICE)
BILIRUBIN URINE: NEGATIVE
Glucose, UA: 500 mg/dL — AB
KETONES UR: NEGATIVE mg/dL
Nitrite: NEGATIVE
PH: 6 (ref 5.0–8.0)
PROTEIN: NEGATIVE mg/dL
Urobilinogen, UA: 0.2 mg/dL (ref 0.0–1.0)

## 2017-07-11 LAB — GLUCOSE, CAPILLARY: GLUCOSE-CAPILLARY: 326 mg/dL — AB (ref 65–99)

## 2017-07-11 NOTE — Patient Instructions (Addendum)
Diabetes and Foot Care Diabetes may cause you to have problems because of poor blood supply (circulation) to your feet and legs. This may cause the skin on your feet to become thinner, break easier, and heal more slowly. Your skin may become dry, and the skin may peel and crack. You may also have nerve damage in your legs and feet causing decreased feeling in them. You may not notice minor injuries to your feet that could lead to infections or more serious problems. Taking care of your feet is one of the most important things you can do for yourself. Follow these instructions at home:  Wear shoes at all times, even in the house. Do not go barefoot. Bare feet are easily injured.  Check your feet daily for blisters, cuts, and redness. If you cannot see the bottom of your feet, use a mirror or ask someone for help.  Wash your feet with warm water (do not use hot water) and mild soap. Then pat your feet and the areas between your toes until they are completely dry. Do not soak your feet as this can dry your skin.  Apply a moisturizing lotion or petroleum jelly (that does not contain alcohol and is unscented) to the skin on your feet and to dry, brittle toenails. Do not apply lotion between your toes.  Trim your toenails straight across. Do not dig under them or around the cuticle. File the edges of your nails with an emery board or nail file.  Do not cut corns or calluses or try to remove them with medicine.  Wear clean socks or stockings every day. Make sure they are not too tight. Do not wear knee-high stockings since they may decrease blood flow to your legs.  Wear shoes that fit properly and have enough cushioning. To break in new shoes, wear them for just a few hours a day. This prevents you from injuring your feet. Always look in your shoes before you put them on to be sure there are no objects inside.  Do not cross your legs. This may decrease the blood flow to your feet.  If you find a  minor scrape, cut, or break in the skin on your feet, keep it and the skin around it clean and dry. These areas may be cleansed with mild soap and water. Do not cleanse the area with peroxide, alcohol, or iodine.  When you remove an adhesive bandage, be sure not to damage the skin around it.  If you have a wound, look at it several times a day to make sure it is healing.  Do not use heating pads or hot water bottles. They may burn your skin. If you have lost feeling in your feet or legs, you may not know it is happening until it is too late.  Make sure your health care provider performs a complete foot exam at least annually or more often if you have foot problems. Report any cuts, sores, or bruises to your health care provider immediately. Contact a health care provider if:  You have an injury that is not healing.  You have cuts or breaks in the skin.  You have an ingrown nail.  You notice redness on your legs or feet.  You feel burning or tingling in your legs or feet.  You have pain or cramps in your legs and feet.  Your legs or feet are numb.  Your feet always feel cold. Get help right away if:  There is increasing   redness, swelling, or pain in or around a wound.  There is a red line that goes up your leg.  Pus is coming from a wound.  You develop a fever or as directed by your health care provider.  You notice a bad smell coming from an ulcer or wound. This information is not intended to replace advice given to you by your health care provider. Make sure you discuss any questions you have with your health care provider. Document Released: 11/01/2000 Document Revised: 04/11/2016 Document Reviewed: 04/13/2013 Elsevier Interactive Patient Education  2017 [place] for Diabetes Mellitus, Adult Carbohydrate counting is a method for keeping track of how many carbohydrates you eat. Eating carbohydrates naturally increases the amount of sugar (glucose)  in the blood. Counting how many carbohydrates you eat helps keep your blood glucose within normal limits, which helps you manage your diabetes (diabetes mellitus). It is important to know how many carbohydrates you can safely have in each meal. This is different for every person. A diet and nutrition specialist (registered dietitian) can help you make a meal plan and calculate how many carbohydrates you should have at each meal and snack. Carbohydrates are found in the following foods:  Grains, such as breads and cereals.  Dried beans and soy products.  Starchy vegetables, such as potatoes, peas, and corn.  Fruit and fruit juices.  Milk and yogurt.  Sweets and snack foods, such as cake, cookies, candy, chips, and soft drinks.  How do I count carbohydrates? There are two ways to count carbohydrates in food. You can use either of the methods or a combination of both. Reading "Nutrition Facts" on packaged food The "Nutrition Facts" list is included on the labels of almost all packaged foods and beverages in the [place] It includes:  The serving size.  Information about nutrients in each serving, including the grams (g) of carbohydrate per serving.  To use the "Nutrition Facts":  Decide how many servings you will have.  Multiply the number of servings by the number of carbohydrates per serving.  The resulting number is the total amount of carbohydrates that you will be having.  Learning standard serving sizes of other foods When you eat foods containing carbohydrates that are not packaged or do not include "Nutrition Facts" on the label, you need to measure the servings in order to count the amount of carbohydrates:  Measure the foods that you will eat with a food scale or measuring cup, if needed.  Decide how many standard-size servings you will eat.  Multiply the number of servings by 15. Most carbohydrate-rich foods have about 15 g of carbohydrates per serving. ? For example, if  you eat 8 oz (170 g) of strawberries, you will have eaten 2 servings and 30 g of carbohydrates (2 servings x 15 g = 30 g).  For foods that have more than one food mixed, such as soups and casseroles, you must count the carbohydrates in each food that is included.  The following list contains standard serving sizes of common carbohydrate-rich foods. Each of these servings has about 15 g of carbohydrates:   hamburger bun or  English muffin.   oz (15 mL) syrup.   oz (14 g) jelly.  1 slice of bread.  1 six-inch tortilla.  3 oz (85 g) cooked rice or pasta.  4 oz (113 g) cooked dried beans.  4 oz (113 g) starchy vegetable, such as peas, corn, or potatoes.  4 oz (113 g) hot cereal.  4 oz (113 g) mashed potatoes or  of a large baked potato.  4 oz (113 g) canned or frozen fruit.  4 oz (120 mL) fruit juice.  4-6 crackers.  6 chicken nuggets.  6 oz (170 g) unsweetened dry cereal.  6 oz (170 g) plain fat-free yogurt or yogurt sweetened with artificial sweeteners.  8 oz (240 mL) milk.  8 oz (170 g) fresh fruit or one small piece of fruit.  24 oz (680 g) popped popcorn.  Example of carbohydrate counting Sample meal  3 oz (85 g) chicken breast.  6 oz (170 g) brown rice.  4 oz (113 g) corn.  8 oz (240 mL) milk.  8 oz (170 g) strawberries with sugar-free whipped topping. Carbohydrate calculation 1. Identify the foods that contain carbohydrates: ? Rice. ? Corn. ? Milk. ? Strawberries. 2. Calculate how many servings you have of each food: ? 2 servings rice. ? 1 serving corn. ? 1 serving milk. ? 1 serving strawberries. 3. Multiply each number of servings by 15 g: ? 2 servings rice x 15 g = 30 g. ? 1 serving corn x 15 g = 15 g. ? 1 serving milk x 15 g = 15 g. ? 1 serving strawberries x 15 g = 15 g. 4. Add together all of the amounts to find the total grams of carbohydrates eaten: ? 30 g + 15 g + 15 g + 15 g = 75 g of carbohydrates total. This information is  not intended to replace advice given to you by your health care provider. Make sure you discuss any questions you have with your health care provider. Document Released: 11/04/2005 Document Revised: 05/24/2016 Document Reviewed: 04/17/2016 Elsevier Interactive Patient Education  2018 [employer]. Diabetes Mellitus and Food It is important for you to manage your blood sugar (glucose) level. Your blood glucose level can be greatly affected by what you eat. Eating healthier foods in the appropriate amounts throughout the day at about the same time each day will help you control your blood glucose level. It can also help slow or prevent worsening of your diabetes mellitus. Healthy eating may even help you improve the level of your blood pressure and reach or maintain a healthy weight. General recommendations for healthful eating and cooking habits include:  Eating meals and snacks regularly. Avoid going long periods of time without eating to lose weight.  Eating a diet that consists mainly of plant-based foods, such as fruits, vegetables, nuts, legumes, and whole grains.  Using low-heat cooking methods, such as baking, instead of high-heat cooking methods, such as deep frying.  Work with your dietitian to make sure you understand how to use the Nutrition Facts information on food labels. How can food affect me? Carbohydrates Carbohydrates affect your blood glucose level more than any other type of food. Your dietitian will help you determine how many carbohydrates to eat at each meal and teach you how to count carbohydrates. Counting carbohydrates is important to keep your blood glucose at a healthy level, especially if you are using insulin or taking certain medicines for diabetes mellitus. Alcohol Alcohol can cause sudden decreases in blood glucose (hypoglycemia), especially if you use insulin or take certain medicines for diabetes mellitus. Hypoglycemia can be a life-threatening condition.  Symptoms of hypoglycemia (sleepiness, dizziness, and disorientation) are similar to symptoms of having too much alcohol. If your health care provider has given you approval to drink alcohol, do so in moderation and use the following guidelines:  Women  should not have more than one drink per day, and men should not have more than two drinks per day. One drink is equal to: ? 12 oz of beer. ? 5 oz of wine. ? 1 oz of hard liquor.  Do not drink on an empty stomach.  Keep yourself hydrated. Have water, diet soda, or unsweetened iced tea.  Regular soda, juice, and other mixers might contain a lot of carbohydrates and should be counted.  What foods are not recommended? As you make food choices, it is important to remember that all foods are not the same. Some foods have fewer nutrients per serving than other foods, even though they might have the same number of calories or carbohydrates. It is difficult to get your body what it needs when you eat foods with fewer nutrients. Examples of foods that you should avoid that are high in calories and carbohydrates but low in nutrients include:  Trans fats (most processed foods list trans fats on the Nutrition Facts label).  Regular soda.  Juice.  Candy.  Sweets, such as cake, pie, doughnuts, and cookies.  Fried foods.  What foods can I eat? Eat nutrient-rich foods, which will nourish your body and keep you healthy. The food you should eat also will depend on several factors, including:  The calories you need.  The medicines you take.  Your weight.  Your blood glucose level.  Your blood pressure level.  Your cholesterol level.  You should eat a variety of foods, including:  Protein. ? Lean cuts of meat. ? Proteins low in saturated fats, such as fish, egg whites, and beans. Avoid processed meats.  Fruits and vegetables. ? Fruits and vegetables that may help control blood glucose levels, such as apples, mangoes, and yams.  Dairy  products. ? Choose fat-free or low-fat dairy products, such as milk, yogurt, and cheese.  Grains, bread, pasta, and rice. ? Choose whole grain products, such as multigrain bread, whole oats, and brown rice. These foods may help control blood pressure.  Fats. ? Foods containing healthful fats, such as nuts, avocado, olive oil, canola oil, and fish.  Does everyone with diabetes mellitus have the same meal plan? Because every person with diabetes mellitus is different, there is not one meal plan that works for everyone. It is very important that you meet with a dietitian who will help you create a meal plan that is just right for you. This information is not intended to replace advice given to you by your health care provider. Make sure you discuss any questions you have with your health care provider. Document Released: 08/01/2005 Document Revised: 04/11/2016 Document Reviewed: 10/01/2013 Elsevier Interactive Patient Education  2017 [place].  Hypoglycemia Hypoglycemia is when the sugar (glucose) level in the blood is too low. Symptoms of low blood sugar may include: Feeling: Hungry. Worried or nervous (anxious). Sweaty and clammy. Confused. Dizzy. Sleepy. Sick to your stomach (nauseous). Having: A fast heartbeat. A headache. A change in your vision. Jerky movements that you cannot control (seizure). Nightmares. Tingling or no feeling (numbness) around the mouth, lips, or tongue. Having trouble with: Talking. Paying attention (concentrating). Moving (coordination). Sleeping. Shaking. Passing out (fainting). Getting upset easily (irritability).  Low blood sugar can happen to people who have diabetes and people who do not have diabetes. Low blood sugar can happen quickly, and it can be an emergency. Treating Low Blood Sugar Low blood sugar is often treated by eating or drinking something sugary right away. If you can  think clearly and swallow safely, follow the 15:15  rule: Take 15 grams of a fast-acting carb (carbohydrate). Some fast-acting carbs are: 1 tube of glucose gel. 3 sugar tablets (glucose pills). 6-8 pieces of hard candy. 4 oz (120 mL) of fruit juice. 4 oz (120 mL) of regular (not diet) soda. Check your blood sugar 15 minutes after you take the carb. If your blood sugar is still at or below 70 mg/dL (3.9 mmol/L), take 15 grams of a carb again. If your blood sugar does not go above 70 mg/dL (3.9 mmol/L) after 3 tries, get help right away. After your blood sugar goes back to normal, eat a meal or a snack within 1 hour.  Treating Very Low Blood Sugar If your blood sugar is at or below 54 mg/dL (3 mmol/L), you have very low blood sugar (severe hypoglycemia). This is an emergency. Do not wait to see if the symptoms will go away. Get medical help right away. Call your local emergency services (911 in the [place] Do not drive yourself to the hospital. If you have very low blood sugar and you cannot eat or drink, you may need a glucagon shot (injection). A family member or friend should learn how to check your blood sugar and how to give you a glucagon shot. Ask your doctor if you need to have a glucagon shot kit at home. Follow these instructions at home: General instructions Avoid any diets that cause you to not eat enough food. Talk with your doctor before you start any new diet. Take over-the-counter and prescription medicines only as told by your doctor. Limit alcohol to no more than 1 drink per day for nonpregnant women and 2 drinks per day for men. One drink equals 12 oz of beer, 5 oz of wine, or 1 oz of hard liquor. Keep all follow-up visits as told by your doctor. This is important. If You Have Diabetes:  Make sure you know the symptoms of low blood sugar. Always keep a source of sugar with you, such as: Sugar. Sugar tablets. Glucose gel. Fruit juice. Regular soda (not diet soda). Milk. Hard candy. Honey. Take your medicines as  told. Follow your exercise and meal plan. Eat on time. Do not skip meals. Follow your sick day plan when you cannot eat or drink normally. Make this plan ahead of time with your doctor. Check your blood sugar as often as told by your doctor. Always check before and after exercise. Share your diabetes care plan with: Your work or school. People you live with. Check your pee (urine) for ketones: When you are sick. As told by your doctor. Carry a card or wear jewelry that says you have diabetes. If You Have Low Blood Sugar From Other Causes:  Check your blood sugar as often as told by your doctor. Follow instructions from your doctor about what you cannot eat or drink. Contact a doctor if: You have trouble keeping your blood sugar in your target range. You have low blood sugar often. Get help right away if: You still have symptoms after you eat or drink something sugary. Your blood sugar is at or below 54 mg/dL (3 mmol/L). You have jerky movements that you cannot control. You pass out. These symptoms may be an emergency. Do not wait to see if the symptoms will go away. Get medical help right away. Call your local emergency services (911 in the [place] Do not drive yourself to the hospital. This information is not intended to replace  advice given to you by your health care provider. Make sure you discuss any questions you have with your health care provider. Document Released: 01/29/2010 Document Revised: 04/11/2016 Document Reviewed: 12/08/2015 Elsevier Interactive Patient Education  2018 [employer].  Hyperglycemia Hyperglycemia is when the sugar (glucose) level in your blood is too high. It may not cause symptoms. If you do have symptoms, they may include warning signs, such as:  Feeling more thirsty than normal.  Hunger.  Feeling tired.  Needing to pee (urinate) more than normal.  Blurry eyesight (vision).  You may get other symptoms as it gets worse, such as:  Dry  mouth.  Not being hungry (loss of appetite).  Fruity-smelling breath.  Weakness.  Weight gain or loss that is not planned. Weight loss may be fast.  A tingling or numb feeling in your hands or feet.  Headache.  Skin that does not bounce back quickly when it is lightly pinched and released (poor skin turgor).  Pain in your belly (abdomen).  Cuts or bruises that heal slowly.  High blood sugar can happen to people who do or do not have diabetes. High blood sugar can happen slowly or quickly, and it can be an emergency. Follow these instructions at home: General instructions  Take over-the-counter and prescription medicines only as told by your doctor.  Do not use products that contain nicotine or tobacco, such as cigarettes and e-cigarettes. If you need help quitting, ask your doctor.  Limit alcohol intake to no more than 1 drink per day for nonpregnant women and 2 drinks per day for men. One drink equals 12 oz of beer, 5 oz of wine, or 1 oz of hard liquor.  Manage stress. If you need help with this, ask your doctor.  Keep all follow-up visits as told by your doctor. This is important. Eating and drinking  Stay at a healthy weight.  Exercise regularly, as told by your doctor.  Drink enough fluid, especially when you: ? Exercise. ? Get sick. ? Are in hot temperatures.  Eat healthy foods, such as: ? Low-fat (lean) proteins. ? Complex carbs (complex carbohydrates), such as whole wheat bread or brown rice. ? Fresh fruits and vegetables. ? Low-fat dairy products. ? Healthy fats.  Drink enough fluid to keep your pee (urine) clear or pale yellow. If you have diabetes:  Make sure you know the symptoms of hyperglycemia.  Follow your diabetes management plan, as told by your doctor. Make sure you: ? Take insulin and medicines as told. ? Follow your exercise plan. ? Follow your meal plan. Eat on time. Do not skip meals. ? Check your blood sugar as often as told. Make  sure to check before and after exercise. If you exercise longer or in a different way than you normally do, check your blood sugar more often. ? Follow your sick day plan whenever you cannot eat or drink normally. Make this plan ahead of time with your doctor.  Share your diabetes management plan with people in your workplace, school, and household.  Check your urine for ketones when you are ill and as told by your doctor.  Carry a card or wear jewelry that says that you have diabetes. Contact a doctor if:  Your blood sugar level is higher than 240 mg/dL (13.3 mmol/L) for 2 days in a row.  You have problems keeping your blood sugar in your target range.  High blood sugar happens often for you. Get help right away if:  You have  trouble breathing.  You have a change in how you think, feel, or act (mental status).  You feel sick to your stomach (nauseous), and that feeling does not go away.  You cannot stop throwing up (vomiting). These symptoms may be an emergency. Do not wait to see if the symptoms will go away. Get medical help right away. Call your local emergency services (911 in the [place] Do not drive yourself to the hospital. Summary  Hyperglycemia is when the sugar (glucose) level in your blood is too high.  High blood sugar can happen to people who do or do not have diabetes.  Make sure you drink enough fluids, eat healthy foods, and exercise regularly.  Contact your doctor if you have problems keeping your blood sugar in your target range. This information is not intended to replace advice given to you by your health care provider. Make sure you discuss any questions you have with your health care provider. Document Released: 09/01/2009 Document Revised: 07/22/2016 Document Reviewed: 07/22/2016 Elsevier Interactive Patient Education  2017 [employer].

## 2017-07-11 NOTE — Progress Notes (Signed)
Subjective:    Patient ID: Annette Macdonald, female    DOB: 1951/10/22, 66 y.o.   MRN: [id number]  Annette Macdonald, a 66 year old african [place] female that was newly diagnosed with type 2 diabetes mellitus presents for a medication management appointment. She evaluated in clinic on 07/02/2017 and found to have a hemoglobin a1c of 14.1, which is consistent with uncontrolled type 2 diabetes mellitus. Patient was prescribed Lantus 20 units at bedtime and Metformin 500 mg twice daily. She has not started medication regimen due to transportation constraints and has not been following a carbohydrate modified diet. Patient was advised to come in for additional teaching.    Diabetes  She has type 2 diabetes mellitus. Associated symptoms include polydipsia and polyphagia. Pertinent negatives for diabetes include no blurred vision, no chest pain, no fatigue, no foot paresthesias, no foot ulcerations, no polyuria, no visual change, no weakness and no weight loss. There are no hypoglycemic complications. Symptoms are stable. There are no diabetic complications. She has not had a previous visit with a dietitian. An ACE inhibitor/angiotensin II receptor blocker is not being taken. She does not see a podiatrist.Eye exam is not current.   Past Medical History:  Diagnosis Date  . Fatigue   . Tobacco dependence    Social History   Social History  . Marital status: Single    Spouse name: N/A  . Number of children: N/A  . Years of education: N/A   Occupational History  . Not on file.   Social History Main Topics  . Smoking status: Never Smoker  . Smokeless tobacco: Never Used  . Alcohol use No  . Drug use: No  . Sexual activity: Not on file   Other Topics Concern  . Not on file   Social History Narrative  . No narrative on file   There is no immunization history on file for this patient. Allergies  Allergen Reactions  . Penicillins Rash   Review of Systems  Constitutional: Negative for  fatigue and weight loss.  HENT: Negative.   Eyes: Negative for blurred vision.  Respiratory: Negative.   Cardiovascular: Negative.  Negative for chest pain.  Gastrointestinal: Negative.   Endocrine: Positive for polydipsia and polyphagia. Negative for polyuria.  Genitourinary: Negative.   Musculoskeletal: Negative.   Allergic/Immunologic: Negative.   Neurological: Negative.  Negative for weakness.  Hematological: Negative.   Psychiatric/Behavioral: Negative.        Objective:   Physical Exam  Constitutional: She is oriented to person, place, and time. She appears well-developed and well-nourished.  HENT:  Head: Normocephalic and atraumatic.  Right Ear: External ear normal.  Left Ear: External ear normal.  Mouth/Throat: Oropharynx is clear and moist.  Eyes: Pupils are equal, round, and reactive to light. Conjunctivae are normal.  Neck: Normal range of motion. Neck supple.  Cardiovascular: Normal rate, regular rhythm, normal heart sounds and intact distal pulses.   Pulmonary/Chest: Effort normal and breath sounds normal.  Abdominal: Soft. Bowel sounds are normal.  Neurological: She is alert and oriented to person, place, and time.  Skin: Skin is warm and dry.  Psychiatric: She has a normal mood and affect. Her behavior is normal. Judgment and thought content normal.       Assessment & Plan:  1. Type 2 diabetes mellitus without complication, with long-term current use of insulin ([place]) Discussed diabetes mellitus with patient at length.  Discussed the importance of obtaining a hemoglobin a1C every 3 months Discussed the diabetic foot exam and  checking feet consistently  - Ambulatory referral to Ophthalmology - Ambulatory referral to diabetic education  2. Diabetes education, encounter for Given written information of carbohydrate modified diet.  Patient given instruction on how to administer insulin subcutaneously and check CBGs per teach back method.   Patient refused  vaccinations on today Your A1C goal is less than 7. Your fasting blood sugar  Upon awakening goal is between 110-140.  Your LDL  (bad cholesterol goal is less than 100 Blood pressure goal is <140/90.  Recommend a lowfat, low carbohydrate diet divided over 5-6 small meals, increase water intake to 6-8 glasses, and 150 minutes per week of cardiovascular exercise.   Take your medications as prescribed Make sure that you are familiar with each one of your medications and what they are used to treat.  If you are unsure of medications, please bring to follow up Will send referral for eye exam  Please keep your scheduled follow up appointment.    RTC: 1 month for DMII   Donia Pounds  MSN, FNP-C [facility] [address] [place], [place] [address] [phone]

## 2017-07-13 ENCOUNTER — Telehealth: Payer: Self-pay | Admitting: Family Medicine

## 2017-07-13 DIAGNOSIS — D259 Leiomyoma of uterus, unspecified: Secondary | ICD-10-CM

## 2017-07-13 NOTE — Telephone Encounter (Signed)
Annette Macdonald, a 66 year old female that presented to establish care with me on 07/02/2017. Patient had a workup for dysfunctional uterine that included a pelvic/transvagional ultrasound, which showed a leiomyomatous (uterine fibroids) uterus. The largest fibroids were seen at the uterine fundus, which are the likely source for the dysfunctional uterine bleeding. I will send a referral to gynecology for further workup and evaluation.    Donia Pounds  MSN, FNP-C [facility] [address] [place], [place] [address] [phone]

## 2017-07-14 NOTE — Telephone Encounter (Signed)
Called, no answer. No voicemail picked up. Will try later.

## 2017-07-16 NOTE — Telephone Encounter (Signed)
Called, no answer. No voicemail picked up. Will try later.

## 2017-07-18 NOTE — Telephone Encounter (Signed)
I have tried to call patient multiple times with no success. I will mail letter informing patient of results. Thanks!

## 2017-08-11 ENCOUNTER — Ambulatory Visit: Payer: Medicare Other | Admitting: Gastroenterology

## 2019-01-24 IMAGING — US US PELVIS COMPLETE
2 series · 13 of 25 positions shown · non-contrast
Comparison: None

CLINICAL DATA: Dysfunctional uterine bleeding.

EXAM:
TRANSABDOMINAL AND TRANSVAGINAL ULTRASOUND OF PELVIS
TECHNIQUE: Both transabdominal and transvaginal ultrasound examinations of the
pelvis were performed. Transabdominal technique was performed for
global imaging of the pelvis including uterus, ovaries, adnexal
regions, and pelvic cul-de-sac. It was necessary to proceed with
endovaginal exam following the transabdominal exam to visualize the
endometrial complex and adnexal structures to an adequate degree..

[Series 1: us pelvis complete · 0.20mm/px · 122 acquisitions, 12 frames shown (1 of 2)]
[im 1/122]
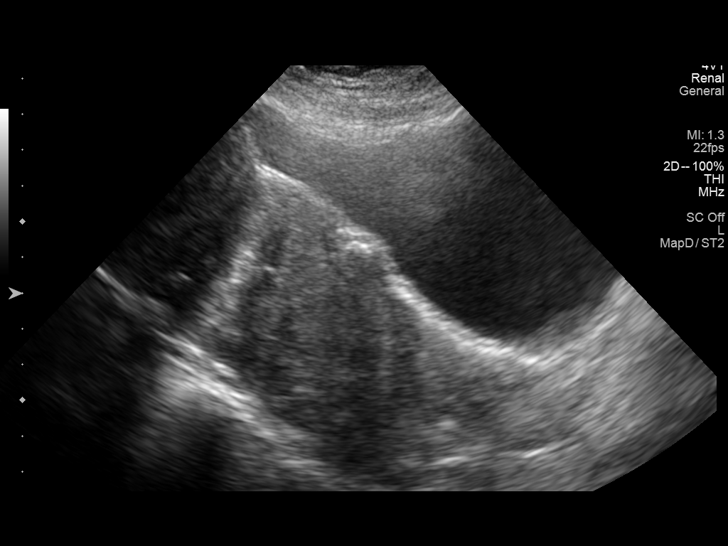
[im 11/122]
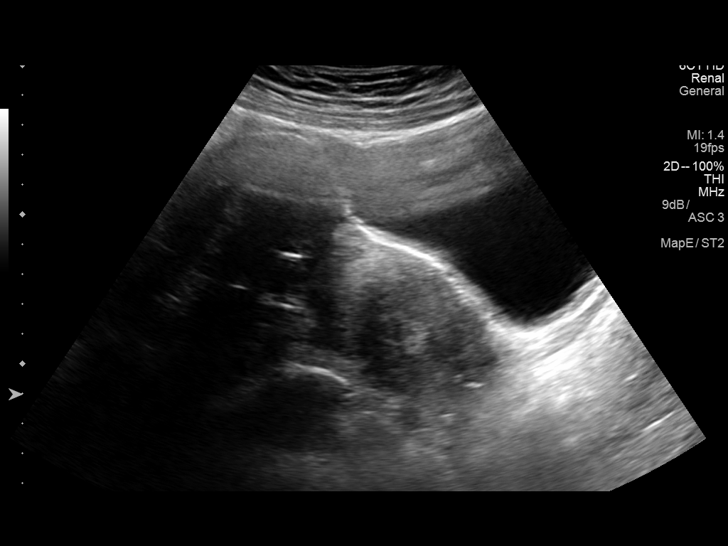
[im 22/122]
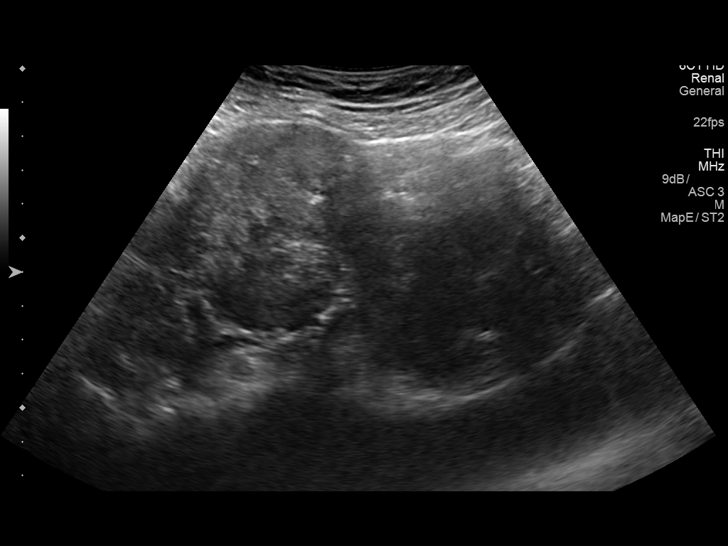
[im 32/122]
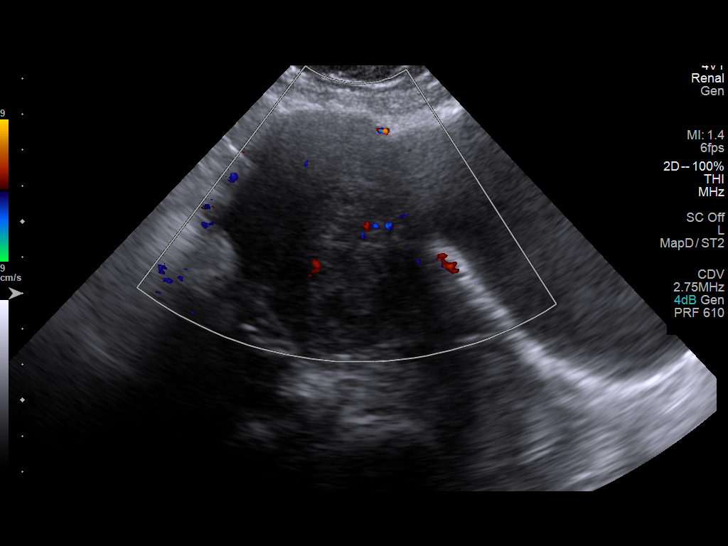
[im 43/122]
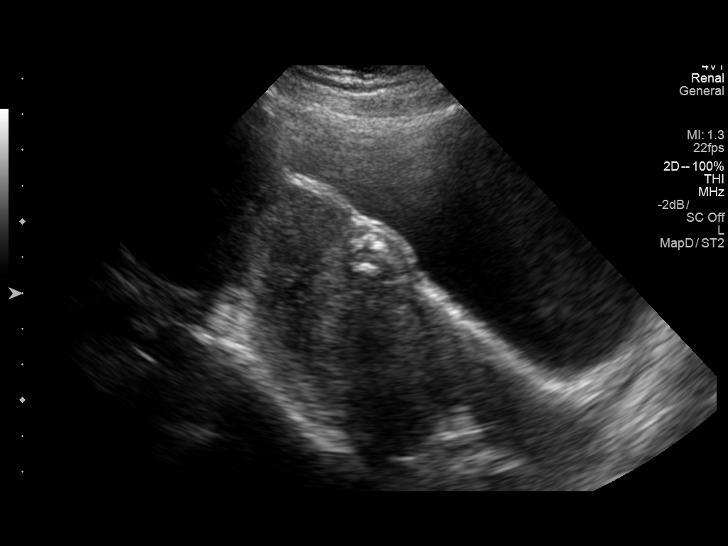
[im 53/122]
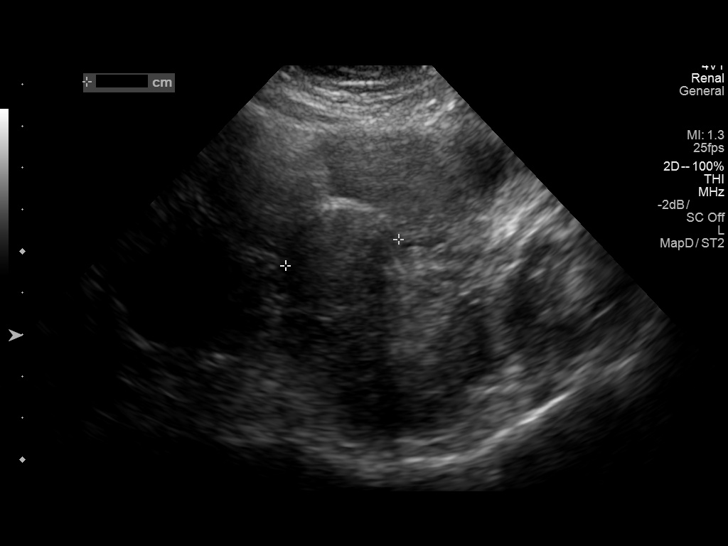
[im 64/122]
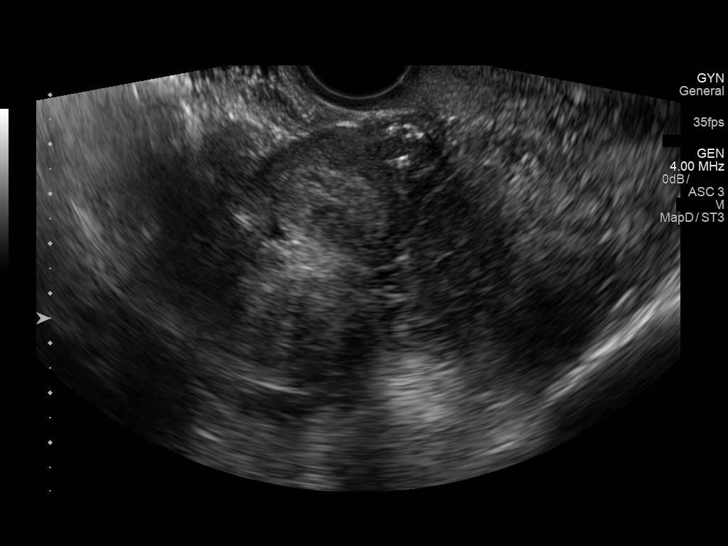
[im 74/122]
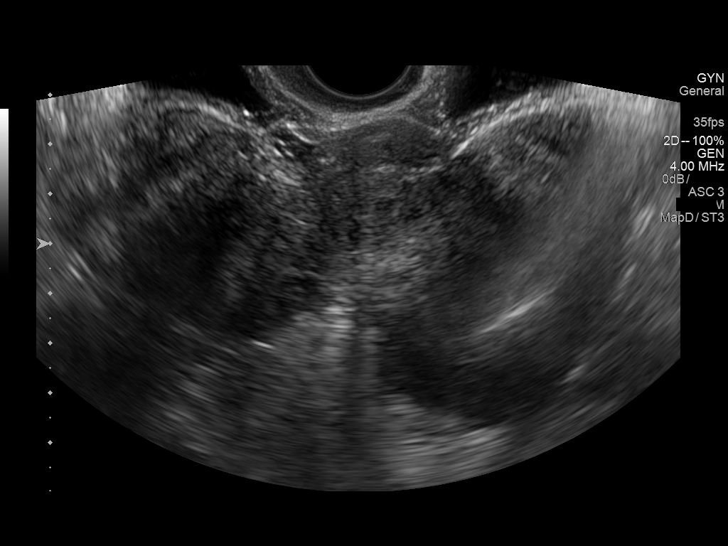
[im 85/122]
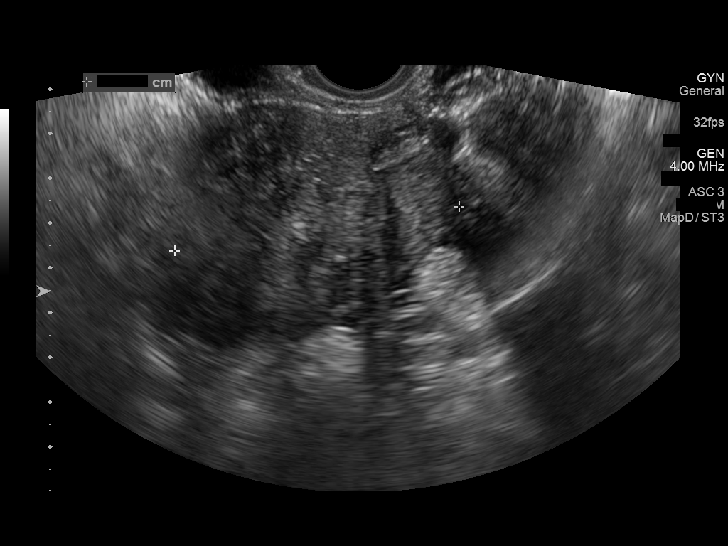
[im 95/122]
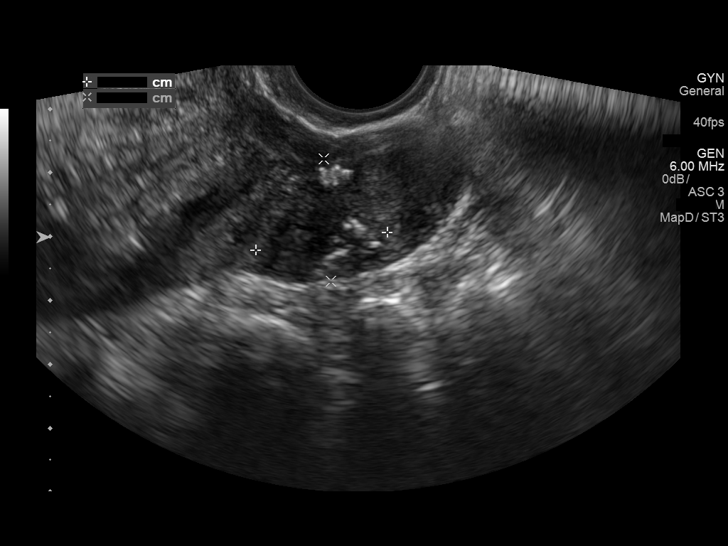
[im 106/122]
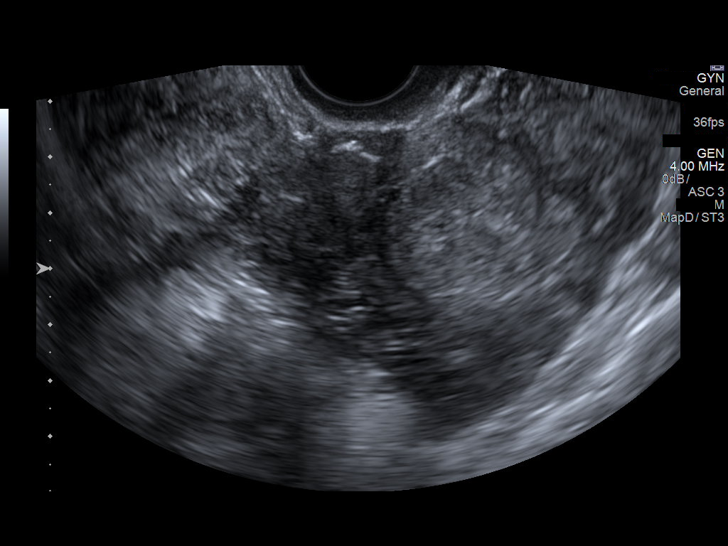
[im 116/122]
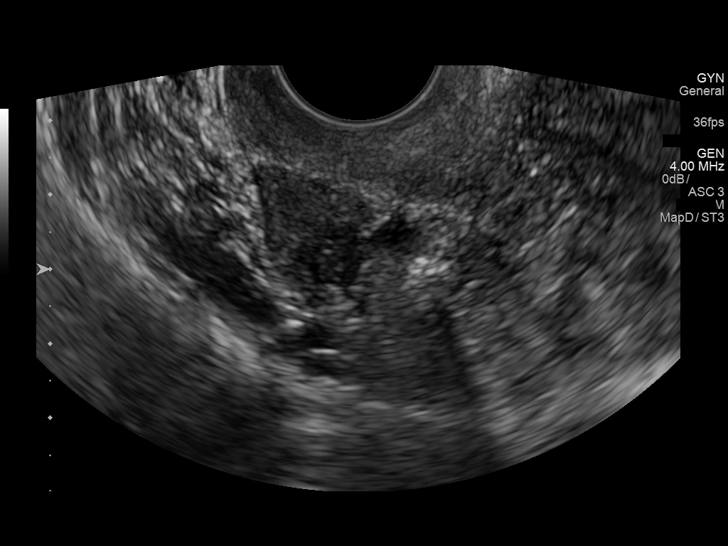

[Series 2: us pelvis complete · 0.24mm/px · 1 of 1 slices shown (2 of 2)]
[im 1/1]
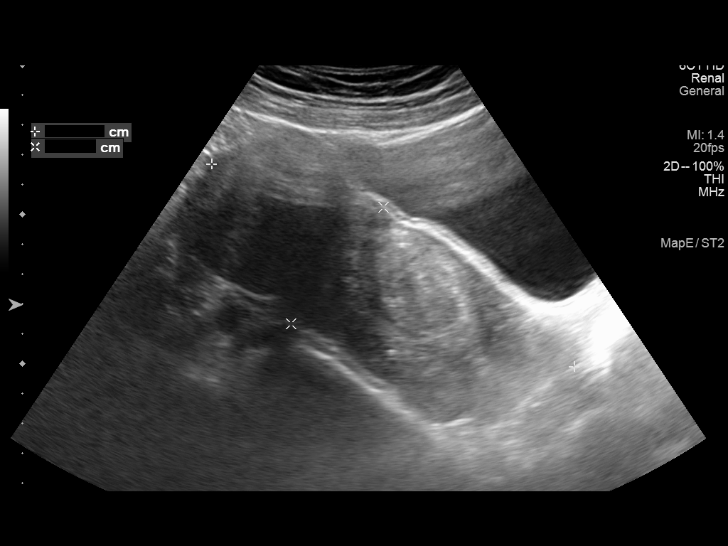

[13 of 25 positions shown; findings below may reference images not displayed]

FINDINGS: Uterus

Measurements: 13.9 x 5.0 x 11.8 cm. Diffusely heterogeneous
containing multiple fibroids. Largest fibroids are seen at the
uterine fundus, on the right measuring 6.8 x 6.5 x 6.4 cm, on the
left measuring 6.6 x 5.2 x 6.5 cm. At least 1 mass near the fundus
abuts the endometrial complex, suspected submucosal, labeled #4 on
today's ultrasound images, measuring 3.7 cm greatest dimension.

Endometrium

Not able to be visualized due to the overlying fibroids.

Right ovary

Measurements: 2.2 x 1.1 x 1.4 cm.. Normal appearance/no adnexal
mass.

Left ovary

Not seen but there is no mass or free fluid identified within the
left adnexal region.

Other findings

No abnormal free fluid.
IMPRESSION: 1. Leiomyomatous uterus. Largest fibroids are seen at the uterine
fundus, exophytic, measuring up to 6.8 cm greatest dimension.
Suspected submucosal fibroid abuts the endometrial complex near the
fundus, measuring 3.7 cm greatest dimension, a likely source for
dysfunctional uterine bleeding.
2. Endometrium cannot be visualized due to the overlying fibroids.
Consider uterus MRI if further characterization is needed.
3. Right ovary appears normal and there is no mass or free fluid
identified in the right adnexal region.
4. Left ovary is not seen but there is no mass or free fluid
identified in the left adnexal region.

## 2019-11-01 ENCOUNTER — Ambulatory Visit (INDEPENDENT_AMBULATORY_CARE_PROVIDER_SITE_OTHER): Payer: Medicare Other | Admitting: Family Medicine

## 2019-11-01 ENCOUNTER — Encounter: Payer: Self-pay | Admitting: Family Medicine

## 2019-11-01 ENCOUNTER — Other Ambulatory Visit: Payer: Self-pay

## 2019-11-01 VITALS — BP 124/62 | HR 94 | Temp 97.6°F | Ht 68.0 in | Wt 145.2 lb

## 2019-11-01 DIAGNOSIS — Z0000 Encounter for general adult medical examination without abnormal findings: Secondary | ICD-10-CM

## 2019-11-01 DIAGNOSIS — Z872 Personal history of diseases of the skin and subcutaneous tissue: Secondary | ICD-10-CM | POA: Diagnosis not present

## 2019-11-01 DIAGNOSIS — J309 Allergic rhinitis, unspecified: Secondary | ICD-10-CM | POA: Insufficient documentation

## 2019-11-01 DIAGNOSIS — Z8639 Personal history of other endocrine, nutritional and metabolic disease: Secondary | ICD-10-CM | POA: Diagnosis not present

## 2019-11-01 LAB — URINALYSIS, ROUTINE W REFLEX MICROSCOPIC
Bilirubin Urine: NEGATIVE
Hgb urine dipstick: NEGATIVE
Ketones, ur: NEGATIVE
Leukocytes,Ua: NEGATIVE
Nitrite: NEGATIVE
RBC / HPF: NONE SEEN (ref 0–?)
Specific Gravity, Urine: 1.025 (ref 1.000–1.030)
Total Protein, Urine: NEGATIVE
Urine Glucose: NEGATIVE
Urobilinogen, UA: 0.2 (ref 0.0–1.0)
pH: 5.5 (ref 5.0–8.0)

## 2019-11-01 LAB — COMPREHENSIVE METABOLIC PANEL
ALT: 23 U/L (ref 0–35)
AST: 21 U/L (ref 0–37)
Albumin: 4.3 g/dL (ref 3.5–5.2)
Alkaline Phosphatase: 82 U/L (ref 39–117)
BUN: 10 mg/dL (ref 6–23)
CO2: 26 mEq/L (ref 19–32)
Calcium: 9.6 mg/dL (ref 8.4–10.5)
Chloride: 104 mEq/L (ref 96–112)
Creatinine, Ser: 0.66 mg/dL (ref 0.40–1.20)
GFR: 108.17 mL/min (ref >60.00)
Glucose, Bld: 141 mg/dL — ABNORMAL HIGH (ref 70–99)
Potassium: 3.8 mEq/L (ref 3.5–5.1)
Sodium: 138 mEq/L (ref 135–145)
Total Bilirubin: 0.5 mg/dL (ref 0.2–1.2)
Total Protein: 7.4 g/dL (ref 6.0–8.3)

## 2019-11-01 LAB — CBC
HCT: 42 % (ref 36.0–46.0)
Hemoglobin: 13.6 g/dL (ref 12.0–15.0)
MCHC: 32.3 g/dL (ref 30.0–36.0)
MCV: 82 fl (ref 78.0–100.0)
Platelets: 223 10*3/uL (ref 150.0–400.0)
RBC: 5.12 Mil/uL — ABNORMAL HIGH (ref 3.87–5.11)
RDW: 14.2 % (ref 11.5–15.5)
WBC: 8 10*3/uL (ref 4.0–10.5)

## 2019-11-01 LAB — LIPID PANEL
Cholesterol: 221 mg/dL — ABNORMAL HIGH (ref 0–200)
HDL: 49.4 mg/dL (ref >39.00)
LDL Cholesterol: 134 mg/dL — ABNORMAL HIGH (ref 0–99)
NonHDL: 171.45
Total CHOL/HDL Ratio: 4
Triglycerides: 188 mg/dL — ABNORMAL HIGH (ref 0.0–149.0)
VLDL: 37.6 mg/dL (ref 0.0–40.0)

## 2019-11-01 LAB — MICROALBUMIN / CREATININE URINE RATIO
Creatinine,U: 118.1 mg/dL
Microalb Creat Ratio: 1.3 mg/g (ref 0.0–30.0)
Microalb, Ur: 1.6 mg/dL (ref 0.0–1.9)

## 2019-11-01 LAB — LDL CHOLESTEROL, DIRECT: Direct LDL: 140 mg/dL

## 2019-11-01 NOTE — Progress Notes (Signed)
New Patient Office Visit  Subjective:  Patient ID: Annette Macdonald, female    DOB: 01/12/1951  Age: 68 y.o. MRN: [id number]  CC:  Chief Complaint  Patient presents with  . Establish Care  . Referral    allergy and eye doctor    HPI Annette Macdonald presents for establishment of care and follow-up on her history of diabetes.  Patient lives alone and she is retired from Physicist, medical work.  She is divorced from her husband who was in the [employer].  She does not smoke drink alcohol or use illicit drugs she tells me.  Recent dental work to remove all of her lower teeth.  Significant past medical history of diabetes.  She believes that she did not have diabetes.  Diet want to put her on insulin but she was resistant to this treatment.  She refuses vaccines.  She has never had a colonoscopy.  She feels healthy.  She denies urinary frequency weight loss or fatigue.  She exercises daily by walking.  She would like to see an eye doctor.  She feels as though it is time for a new prescription for her glasses.   History of depression she says this is no longer a problem.  Patient does not drive and has depended on area transportation.  Past Medical History:  Diagnosis Date  . Fatigue   . Tobacco dependence     Past Surgical History:  Procedure Laterality Date  . TOOTH EXTRACTION      Family History  Problem Relation Age of Onset  . Diabetes Mother   . Emphysema Father     Social History   Socioeconomic History  . Marital status: Single    Spouse name: Not on file  . Number of children: Not on file  . Years of education: Not on file  . Highest education level: Not on file  Occupational History  . Not on file  Tobacco Use  . Smoking status: Never Smoker  . Smokeless tobacco: Never Used  Substance and Sexual Activity  . Alcohol use: No  . Drug use: No  . Sexual activity: Not on file  Other Topics Concern  . Not on file  Social History Narrative  . Not on file   Social Determinants  of Health   Financial Resource Strain:   . Difficulty of Paying Living Expenses: Not on file  Food Insecurity:   . Worried About Charity fundraiser in the Last Year: Not on file  . Ran Out of Food in the Last Year: Not on file  Transportation Needs:   . Lack of Transportation (Medical): Not on file  . Lack of Transportation (Non-Medical): Not on file  Physical Activity:   . Days of Exercise per Week: Not on file  . Minutes of Exercise per Session: Not on file  Stress:   . Feeling of Stress : Not on file  Social Connections:   . Frequency of Communication with Friends and Family: Not on file  . Frequency of Social Gatherings with Friends and Family: Not on file  . Attends Religious Services: Not on file  . Active Member of Clubs or Organizations: Not on file  . Attends Archivist Meetings: Not on file  . Marital Status: Not on file  Intimate Partner Violence:   . Fear of Current or Ex-Partner: Not on file  . Emotionally Abused: Not on file  . Physically Abused: Not on file  . Sexually Abused: Not on file  ROS Review of Systems  Constitutional: Negative for chills, diaphoresis, fatigue, fever and unexpected weight change.  HENT: Negative for congestion, postnasal drip and sore throat.   Eyes: Positive for visual disturbance. Negative for photophobia.  Respiratory: Negative.   Cardiovascular: Negative.   Gastrointestinal: Negative.  Negative for anal bleeding and blood in stool.  Endocrine: Negative for polyphagia and polyuria.  Genitourinary: Negative for difficulty urinating, frequency and urgency.  Musculoskeletal: Negative for gait problem and joint swelling.  Skin: Negative for pallor and rash.  Allergic/Immunologic: Negative for immunocompromised state.  Neurological: Negative for weakness and numbness.  Hematological: Does not bruise/bleed easily.  Psychiatric/Behavioral: Negative.     Objective:   Today's Vitals: BP 124/62   Pulse 94   Temp 97.6 F  (36.4 C)   Ht '5\' 8"'  (1.727 m)   Wt 145 lb 3.2 oz (65.9 kg)   SpO2 95%   BMI 22.08 kg/m   Physical Exam Constitutional:      General: She is not in acute distress.    Appearance: Normal appearance. She is normal weight. She is not ill-appearing, toxic-appearing or diaphoretic.  HENT:     Head: Normocephalic and atraumatic.     Right Ear: Tympanic membrane, ear canal and external ear normal. There is no impacted cerumen.     Left Ear: Tympanic membrane, ear canal and external ear normal. There is no impacted cerumen.     Nose: No congestion or rhinorrhea.     Mouth/Throat:     Mouth: Mucous membranes are dry.     Pharynx: Oropharynx is clear. No oropharyngeal exudate or posterior oropharyngeal erythema.  Eyes:     General: No scleral icterus.       Right eye: No discharge.        Left eye: No discharge.     Extraocular Movements: Extraocular movements intact.     Conjunctiva/sclera: Conjunctivae normal.     Pupils: Pupils are equal, round, and reactive to light.  Cardiovascular:     Rate and Rhythm: Normal rate and regular rhythm.     Pulses: Normal pulses.  Pulmonary:     Effort: Pulmonary effort is normal. No respiratory distress.     Breath sounds: Normal breath sounds. No stridor. No wheezing or rhonchi.  Abdominal:     General: Bowel sounds are normal.  Musculoskeletal:     Cervical back: Neck supple. No rigidity or tenderness.     Right lower leg: No edema.     Left lower leg: No edema.  Lymphadenopathy:     Cervical: No cervical adenopathy.  Skin:    General: Skin is dry.  Neurological:     Mental Status: She is alert and oriented to person, place, and time.  Psychiatric:        Mood and Affect: Mood normal.        Behavior: Behavior normal.    Diabetic Foot Exam - Simple   Simple Foot Form Diabetic Foot exam was performed with the following findings: Yes 11/01/2019 11:46 AM  Visual Inspection No deformities, no ulcerations, no other skin breakdown  bilaterally: Yes Sensation Testing Intact to touch and monofilament testing bilaterally: Yes Pulse Check Posterior Tibialis and Dorsalis pulse intact bilaterally: Yes Comments     Assessment & Plan:   Problem List Items Addressed This Visit      Respiratory   Allergic rhinitis     Other   Healthcare maintenance - Primary   Relevant Orders   CBC   Comp Met (  CMET)   Direct LDL   Lipid Profile   Urinalysis, Routine w reflex microscopic   Ambulatory referral to Gynecology   Ambulatory referral to Ophthalmology   Ambulatory referral to Gastroenterology   History of urticaria   History of elevated glucose   Relevant Orders   Comp Met (CMET)   HgB A1c   Urinalysis, Routine w reflex microscopic   Urine Microalbumin w/creat. ratio   Ambulatory referral to Ophthalmology      Outpatient Encounter Medications as of 11/01/2019  Medication Sig  . cetirizine (ZYRTEC) 10 MG tablet Take 10 mg by mouth 2 (two) times daily.  . [DISCONTINUED] glucose blood (ACCU-CHEK AVIVA) test strip Three times per day and at bedtime. Use as instructed (Patient not taking: Reported on 11/01/2019)  . [DISCONTINUED] Insulin Glargine (LANTUS SOLOSTAR) 100 UNIT/ML Solostar Pen Inject 20 Units into the skin daily at 10 pm. (Patient not taking: Reported on 11/01/2019)  . [DISCONTINUED] Insulin Pen Needle (PEN NEEDLES) 31G X 6 MM MISC 1 each by Does not apply route at bedtime. (Patient not taking: Reported on 11/01/2019)  . [DISCONTINUED] Insulin Syringe-Needle U-100 (B-D INS SYR ULTRAFINE .3CC/29G) 29G X 1/2" 0.3 ML MISC 1 each by Does not apply route at bedtime. (Patient not taking: Reported on 11/01/2019)  . [DISCONTINUED] Lancets (ACCU-CHEK SOFT TOUCH) lancets Three times per day and at bedtime. Use as instructed (Patient not taking: Reported on 11/01/2019)  . [DISCONTINUED] metFORMIN (GLUCOPHAGE) 500 MG tablet Take 1 tablet (500 mg total) by mouth 2 (two) times daily with a meal. (Patient not taking:  Reported on 11/01/2019)   No facility-administered encounter medications on file as of 11/01/2019.    Follow-up: Return in about 3 months (around 01/30/2020).   Spent significant time discussing the importance of vaccines for her.  She decided to decline them for now.  She was given information on health maintenance and disease prevention.  If need be we discussed beginning treatment again for her diabetes.  The main thing is is that she does not want to take insulin.  I told her that I would comply with this request to the extent can.  Follow-up will be in 3 months.  Libby Maw, MD

## 2019-11-01 NOTE — Patient Instructions (Signed)
Health Maintenance After Age 66 After age 39, you are at a higher risk for certain long-term diseases and infections as well as injuries from falls. Falls are a major cause of broken bones and head injuries in people who are older than age 66. Getting regular preventive care can help to keep you healthy and well. Preventive care includes getting regular testing and making lifestyle changes as recommended by your health care provider. Talk with your health care provider about:  Which screenings and tests you should have. A screening is a test that checks for a disease when you have no symptoms.  A diet and exercise plan that is right for you. What should I know about screenings and tests to prevent falls? Screening and testing are the best ways to find a health problem early. Early diagnosis and treatment give you the best chance of managing medical conditions that are common after age 8. Certain conditions and lifestyle choices may make you more likely to have a fall. Your health care provider may recommend:  Regular vision checks. Poor vision and conditions such as cataracts can make you more likely to have a fall. If you wear glasses, make sure to get your prescription updated if your vision changes.  Medicine review. Work with your health care provider to regularly review all of the medicines you are taking, including over-the-counter medicines. Ask your health care provider about any side effects that may make you more likely to have a fall. Tell your health care provider if any medicines that you take make you feel dizzy or sleepy.  Osteoporosis screening. Osteoporosis is a condition that causes the bones to get weaker. This can make the bones weak and cause them to break more easily.  Blood pressure screening. Blood pressure changes and medicines to control blood pressure can make you feel dizzy.  Strength and balance checks. Your health care provider may recommend certain tests to check your  strength and balance while standing, walking, or changing positions.  Foot health exam. Foot pain and numbness, as well as not wearing proper footwear, can make you more likely to have a fall.  Depression screening. You may be more likely to have a fall if you have a fear of falling, feel emotionally low, or feel unable to do activities that you used to do.  Alcohol use screening. Using too much alcohol can affect your balance and may make you more likely to have a fall. What actions can I take to lower my risk of falls? General instructions  Talk with your health care provider about your risks for falling. Tell your health care provider if: ? You fall. Be sure to tell your health care provider about all falls, even ones that seem minor. ? You feel dizzy, sleepy, or off-balance.  Take over-the-counter and prescription medicines only as told by your health care provider. These include any supplements.  Eat a healthy diet and maintain a healthy weight. A healthy diet includes low-fat dairy products, low-fat (lean) meats, and fiber from whole grains, beans, and lots of fruits and vegetables. Home safety  Remove any tripping hazards, such as rugs, cords, and clutter.  Install safety equipment such as grab bars in bathrooms and safety rails on stairs.  Keep rooms and walkways well-lit. Activity   Follow a regular exercise program to stay fit. This will help you maintain your balance. Ask your health care provider what types of exercise are appropriate for you.  If you need a cane or  walker, use it as recommended by your health care provider.  Wear supportive shoes that have nonskid soles. Lifestyle  Do not drink alcohol if your health care provider tells you not to drink.  If you drink alcohol, limit how much you have: ? 0-1 drink a day for women. ? 0-2 drinks a day for men.  Be aware of how much alcohol is in your drink. In the [place] one drink equals one typical bottle of beer (12  oz), one-half glass of wine (5 oz), or one shot of hard liquor (1 oz).  Do not use any products that contain nicotine or tobacco, such as cigarettes and e-cigarettes. If you need help quitting, ask your health care provider. Summary  Having a healthy lifestyle and getting preventive care can help to protect your health and wellness after age 1.  Screening and testing are the best way to find a health problem early and help you avoid having a fall. Early diagnosis and treatment give you the best chance for managing medical conditions that are more common for people who are older than age 20.  Falls are a major cause of broken bones and head injuries in people who are older than age 54. Take precautions to prevent a fall at home.  Work with your health care provider to learn what changes you can make to improve your health and wellness and to prevent falls. This information is not intended to replace advice given to you by your health care provider. Make sure you discuss any questions you have with your health care provider. Document Released: 09/17/2017 Document Revised: 02/25/2019 Document Reviewed: 09/17/2017 Elsevier Patient Education  2020 [place] 65 Years and Older, Female Preventive care refers to lifestyle choices and visits with your health care provider that can promote health and wellness. This includes:  A yearly physical exam. This is also called an annual well check.  Regular dental and eye exams.  Immunizations.  Screening for certain conditions.  Healthy lifestyle choices, such as diet and exercise. What can I expect for my preventive care visit? Physical exam Your health care provider will check:  Height and weight. These may be used to calculate body mass index (BMI), which is a measurement that tells if you are at a healthy weight.  Heart rate and blood pressure.  Your skin for abnormal spots. Counseling Your health care provider may  ask you questions about:  Alcohol, tobacco, and drug use.  Emotional well-being.  Home and relationship well-being.  Sexual activity.  Eating habits.  History of falls.  Memory and ability to understand (cognition).  Work and work Statistician.  Pregnancy and menstrual history. What immunizations do I need?  Influenza (flu) vaccine  This is recommended every year. Tetanus, diphtheria, and pertussis (Tdap) vaccine  You may need a Td booster every 10 years. Varicella (chickenpox) vaccine  You may need this vaccine if you have not already been vaccinated. Zoster (shingles) vaccine  You may need this after age 50. Pneumococcal conjugate (PCV13) vaccine  One dose is recommended after age 48. Pneumococcal polysaccharide (PPSV23) vaccine  One dose is recommended after age 1. Measles, mumps, and rubella (MMR) vaccine  You may need at least one dose of MMR if you were born in 1957 or later. You may also need a second dose. Meningococcal conjugate (MenACWY) vaccine  You may need this if you have certain conditions. Hepatitis A vaccine  You may need this if you have certain conditions or  if you travel or work in places where you may be exposed to hepatitis A. Hepatitis B vaccine  You may need this if you have certain conditions or if you travel or work in places where you may be exposed to hepatitis B. Haemophilus influenzae type b (Hib) vaccine  You may need this if you have certain conditions. You may receive vaccines as individual doses or as more than one vaccine together in one shot (combination vaccines). Talk with your health care provider about the risks and benefits of combination vaccines. What tests do I need? Blood tests  Lipid and cholesterol levels. These may be checked every 5 years, or more frequently depending on your overall health.  Hepatitis C test.  Hepatitis B test. Screening  Lung cancer screening. You may have this screening every year  starting at age 69 if you have a 30-pack-year history of smoking and currently smoke or have quit within the past 15 years.  Colorectal cancer screening. All adults should have this screening starting at age 66 and continuing until age 10. Your health care provider may recommend screening at age 67 if you are at increased risk. You will have tests every 1-10 years, depending on your results and the type of screening test.  Diabetes screening. This is done by checking your blood sugar (glucose) after you have not eaten for a while (fasting). You may have this done every 1-3 years.  Mammogram. This may be done every 1-2 years. Talk with your health care provider about how often you should have regular mammograms.  BRCA-related cancer screening. This may be done if you have a family history of breast, ovarian, tubal, or peritoneal cancers. Other tests  Sexually transmitted disease (STD) testing.  Bone density scan. This is done to screen for osteoporosis. You may have this done starting at age 20. Follow these instructions at home: Eating and drinking  Eat a diet that includes fresh fruits and vegetables, whole grains, lean protein, and low-fat dairy products. Limit your intake of foods with high amounts of sugar, saturated fats, and salt.  Take vitamin and mineral supplements as recommended by your health care provider.  Do not drink alcohol if your health care provider tells you not to drink.  If you drink alcohol: ? Limit how much you have to 0-1 drink a day. ? Be aware of how much alcohol is in your drink. In the [place] one drink equals one 12 oz bottle of beer (355 mL), one 5 oz glass of wine (148 mL), or one 1 oz glass of hard liquor (44 mL). Lifestyle  Take daily care of your teeth and gums.  Stay active. Exercise for at least 30 minutes on 5 or more days each week.  Do not use any products that contain nicotine or tobacco, such as cigarettes, e-cigarettes, and chewing tobacco. If  you need help quitting, ask your health care provider.  If you are sexually active, practice safe sex. Use a condom or other form of protection in order to prevent STIs (sexually transmitted infections).  Talk with your health care provider about taking a low-dose aspirin or statin. What's next?  Go to your health care provider once a year for a well check visit.  Ask your health care provider how often you should have your eyes and teeth checked.  Stay up to date on all vaccines. This information is not intended to replace advice given to you by your health care provider. Make sure you discuss any  questions you have with your health care provider. Document Released: 12/01/2015 Document Revised: 10/29/2018 Document Reviewed: 10/29/2018 Elsevier Patient Education  2020 [employer].

## 2019-11-02 LAB — HEMOGLOBIN A1C: Hgb A1c MFr Bld: 7 % — ABNORMAL HIGH (ref 4.6–6.5)

## 2019-11-03 ENCOUNTER — Ambulatory Visit (INDEPENDENT_AMBULATORY_CARE_PROVIDER_SITE_OTHER): Payer: Medicare Other | Admitting: Family Medicine

## 2019-11-03 ENCOUNTER — Encounter: Payer: Self-pay | Admitting: Family Medicine

## 2019-11-03 VITALS — Temp 97.4°F | Ht 68.0 in | Wt 145.0 lb

## 2019-11-03 DIAGNOSIS — E7800 Pure hypercholesterolemia, unspecified: Secondary | ICD-10-CM | POA: Diagnosis not present

## 2019-11-03 DIAGNOSIS — E119 Type 2 diabetes mellitus without complications: Secondary | ICD-10-CM

## 2019-11-03 MED ORDER — PRAVASTATIN SODIUM 20 MG PO TABS: ORAL_TABLET | ORAL | 1 refills | Status: AC

## 2019-11-03 NOTE — Progress Notes (Signed)
Established Patient Office Visit  Subjective:  Patient ID: Annette Macdonald, female    DOB: [birth year]  Age: 68 y.o. MRN: [id number]  CC:  Chief Complaint  Patient presents with  . Follow-up    Lab result discussion    HPI Annette Macdonald presents for a discussion of her lab work.  She has type 2 diabetes but it is well controlled with her current lifestyle.  She is removed sugar from her diet and exercises 5 days a week at least for 30 minutes.  She feels great and is planning on continuing this important lifestyle.  Past Medical History:  Diagnosis Date  . Fatigue   . Tobacco dependence     Past Surgical History:  Procedure Laterality Date  . TOOTH EXTRACTION      Family History  Problem Relation Age of Onset  . Diabetes Mother   . Emphysema Father     Social History   Socioeconomic History  . Marital status: Single    Spouse name: Not on file  . Number of children: Not on file  . Years of education: Not on file  . Highest education level: Not on file  Occupational History  . Not on file  Tobacco Use  . Smoking status: Never Smoker  . Smokeless tobacco: Never Used  Substance and Sexual Activity  . Alcohol use: No  . Drug use: No  . Sexual activity: Not on file  Other Topics Concern  . Not on file  Social History Narrative  . Not on file   Social Determinants of Health   Financial Resource Strain:   . Difficulty of Paying Living Expenses: Not on file  Food Insecurity:   . Worried About Charity fundraiser in the Last Year: Not on file  . Ran Out of Food in the Last Year: Not on file  Transportation Needs:   . Lack of Transportation (Medical): Not on file  . Lack of Transportation (Non-Medical): Not on file  Physical Activity:   . Days of Exercise per Week: Not on file  . Minutes of Exercise per Session: Not on file  Stress:   . Feeling of Stress : Not on file  Social Connections:   . Frequency of Communication with Friends and Family: Not on file    . Frequency of Social Gatherings with Friends and Family: Not on file  . Attends Religious Services: Not on file  . Active Member of Clubs or Organizations: Not on file  . Attends Archivist Meetings: Not on file  . Marital Status: Not on file  Intimate Partner Violence:   . Fear of Current or Ex-Partner: Not on file  . Emotionally Abused: Not on file  . Physically Abused: Not on file  . Sexually Abused: Not on file    Outpatient Medications Prior to Visit  Medication Sig Dispense Refill  . cetirizine (ZYRTEC) 10 MG tablet Take 10 mg by mouth 2 (two) times daily.     No facility-administered medications prior to visit.    Allergies  Allergen Reactions  . Penicillins Rash    ROS Review of Systems  Respiratory: Negative.   Cardiovascular: Negative.   Gastrointestinal: Negative.   Endocrine: Negative for polyphagia and polyuria.  Musculoskeletal: Negative for gait problem and joint swelling.  Psychiatric/Behavioral: Negative.       Objective:    Physical Exam  Constitutional: She is oriented to person, place, and time. No distress.  Pulmonary/Chest: Effort normal.  Neurological: She is alert  and oriented to person, place, and time.  Psychiatric: She has a normal mood and affect. Her behavior is normal.    Temp (!) 97.4 F (36.3 C) (Temporal)   Ht 5\' 8"  (1.727 m)   Wt 145 lb (65.8 kg)   BMI 22.05 kg/m  Wt Readings from Last 3 Encounters:  11/03/19 145 lb (65.8 kg)  11/01/19 145 lb 3.2 oz (65.9 kg)  07/11/17 153 lb (69.4 kg)     Health Maintenance Due  Topic Date Due  . OPHTHALMOLOGY EXAM  01/26/1963  . TETANUS/TDAP  01/26/1972  . MAMMOGRAM  01/26/2003  . COLONOSCOPY  01/26/2003  . DEXA SCAN  01/25/2018  . PNA vac Low Risk Adult (1 of 2 - PCV13) 01/25/2018    There are no preventive care reminders to display for this patient.  Lab Results  Component Value Date   TSH 1.15 07/02/2017   Lab Results  Component Value Date   WBC 8.0  11/01/2019   HGB 13.6 11/01/2019   HCT 42.0 11/01/2019   MCV 82.0 11/01/2019   PLT 223.0 11/01/2019   Lab Results  Component Value Date   NA 138 11/01/2019   K 3.8 11/01/2019   CO2 26 11/01/2019   GLUCOSE 141 (H) 11/01/2019   BUN 10 11/01/2019   CREATININE 0.66 11/01/2019   BILITOT 0.5 11/01/2019   ALKPHOS 82 11/01/2019   AST 21 11/01/2019   ALT 23 11/01/2019   PROT 7.4 11/01/2019   ALBUMIN 4.3 11/01/2019   CALCIUM 9.6 11/01/2019   ANIONGAP 11 03/25/2016   GFR 108.17 11/01/2019   Lab Results  Component Value Date   CHOL 221 (H) 11/01/2019   Lab Results  Component Value Date   HDL 49.40 11/01/2019   Lab Results  Component Value Date   LDLCALC 134 (H) 11/01/2019   Lab Results  Component Value Date   TRIG 188.0 (H) 11/01/2019   Lab Results  Component Value Date   CHOLHDL 4 11/01/2019   Lab Results  Component Value Date   HGBA1C 7.0 (H) 11/01/2019      Assessment & Plan:   Problem List Items Addressed This Visit      Endocrine   Type 2 diabetes mellitus ([place]) - Primary   Relevant Medications   pravastatin (PRAVACHOL) 20 MG tablet     Other   Elevated LDL cholesterol level   Relevant Medications   pravastatin (PRAVACHOL) 20 MG tablet      Meds ordered this encounter  Medications  . pravastatin (PRAVACHOL) 20 MG tablet    Sig: Take one each night before bed.    Dispense:  90 tablet    Refill:  1    Follow-up: Return has appointment in March..   Discussed using Metformin extended release to prevent the progression of her diabetes.  At this time I believe that the patient is controlling her diabetes excellently with lifestyle and we will hold treatment.  Patient agrees.  We discussed that patient is at higher risk for vascular disease even with well-controlled diabetes and she agrees to try a statin.  She will take pravastatin at night and let me know if there are any problems with it including myalgias or fatigue.  Follow-up in 3 months with  scheduled appointment.   Libby Maw, MD   Virtual Visit via Video Note  I connected with [name] on 11/03/19 at  8:30 AM EST by a video enabled telemedicine application and verified that I am speaking with the correct  person using two identifiers.  Location: Patient: home alone Provider:    I discussed the limitations of evaluation and management by telemedicine and the availability of in person appointments. The patient expressed understanding and agreed to proceed.  History of Present Illness:    Observations/Objective:   Assessment and Plan:   Follow Up Instructions:    I discussed the assessment and treatment plan with the patient. The patient was provided an opportunity to ask questions and all were answered. The patient agreed with the plan and demonstrated an understanding of the instructions.   The patient was advised to call back or seek an in-person evaluation if the symptoms worsen or if the condition fails to improve as anticipated.  I provided 27 minutes of non-face-to-face time during this encounter.  Interactive video and audio telecommunications were attempted between myself and the patient. However they failed due to the patient having technical difficulties or not having access to video capability. We continued and completed with audio only.Interactive video and audio telecommunications were attempted between myself and the patient. However they failed due to the patient having technical difficulties or not having access to video capability. We continued and completed with audio only.   Libby Maw, MD

## 2020-01-07 ENCOUNTER — Encounter: Payer: Self-pay | Admitting: Family Medicine

## 2020-01-26 ENCOUNTER — Telehealth: Payer: Self-pay | Admitting: Family Medicine

## 2020-01-26 ENCOUNTER — Other Ambulatory Visit: Payer: Self-pay

## 2020-01-26 MED ORDER — CETIRIZINE HCL 10 MG PO TABS: 10.0000 mg | ORAL_TABLET | Freq: Two times a day (BID) | ORAL | 3 refills | Status: DC

## 2020-01-26 NOTE — Telephone Encounter (Signed)
Patient is calling and requesting a refill for cetirizine be sent to [facility] on [address]. CB is [phone]

## 2020-01-26 NOTE — Telephone Encounter (Signed)
Rx sent in patient aware 

## 2020-01-31 ENCOUNTER — Ambulatory Visit: Payer: Medicare Other | Admitting: Family Medicine

## 2020-06-30 ENCOUNTER — Other Ambulatory Visit: Payer: Self-pay

## 2020-06-30 ENCOUNTER — Telehealth: Payer: Self-pay | Admitting: Family Medicine

## 2020-06-30 MED ORDER — CETIRIZINE HCL 10 MG PO TABS: 10.0000 mg | ORAL_TABLET | Freq: Two times a day (BID) | ORAL | 0 refills | Status: AC

## 2020-06-30 NOTE — Telephone Encounter (Signed)
Patient is calling and requesting a refill for cetirizine 60 day supply sent to [place], please advise. CB is [phone].

## 2024-06-15 ENCOUNTER — Telehealth: Payer: Self-pay

## 2024-06-15 NOTE — Telephone Encounter (Signed)
 Patient was identified as falling into the [place] Measure - Diabetes.   Patient was: Patient is not currently using our practice.

## 2024-10-07 ENCOUNTER — Encounter: Payer: Self-pay | Admitting: *Deleted

## 2024-10-07 NOTE — Progress Notes (Signed)
 Annette Macdonald                                          MRN: [id number]   10/07/2024   The [facility] Quality Team Specialist reviewed this patient medical record for the purposes of chart review for care gap closure. The following were reviewed: chart review for care gap closure-breast cancer screening and colorectal cancer screening.    [facility]I Quality Team
